# Patient Record
Sex: Female | Born: 1945 | Race: White | Hispanic: No | Smoking: Never smoker
Health system: Southern US, Community
[De-identification: ages and names within clinical notes are randomized; demographics above are authoritative.]

## PROBLEM LIST (undated history)

## (undated) DIAGNOSIS — E039 Hypothyroidism, unspecified: Secondary | ICD-10-CM

## (undated) DIAGNOSIS — I10 Essential (primary) hypertension: Secondary | ICD-10-CM

## (undated) DIAGNOSIS — I499 Cardiac arrhythmia, unspecified: Secondary | ICD-10-CM

## (undated) DIAGNOSIS — E079 Disorder of thyroid, unspecified: Secondary | ICD-10-CM

## (undated) DIAGNOSIS — C801 Malignant (primary) neoplasm, unspecified: Secondary | ICD-10-CM

## (undated) DIAGNOSIS — D693 Immune thrombocytopenic purpura: Secondary | ICD-10-CM

## (undated) HISTORY — PX: COLONOSCOPY: SHX5424

## (undated) HISTORY — PX: BACK SURGERY: SHX140

## (undated) HISTORY — DX: Hypothyroidism, unspecified: E03.9

## (undated) HISTORY — DX: Essential (primary) hypertension: I10

## (undated) HISTORY — DX: Immune thrombocytopenic purpura: D69.3

## (undated) HISTORY — DX: Disorder of thyroid, unspecified: E07.9

---

## 2011-12-18 ENCOUNTER — Ambulatory Visit: Payer: Self-pay | Admitting: Internal Medicine

## 2013-09-06 IMAGING — CR RIGHT FOREARM - 2 VIEW
1 series · 2 of 2 positions shown · non-contrast
Comparison: none

REASON FOR EXAM: fell; c/o radial area pain
COMMENTS:   LMP: Post-Menopausal

[Series 1: ap · 0.17mm/px · 2 of 2 slices shown]
[im 1/2]
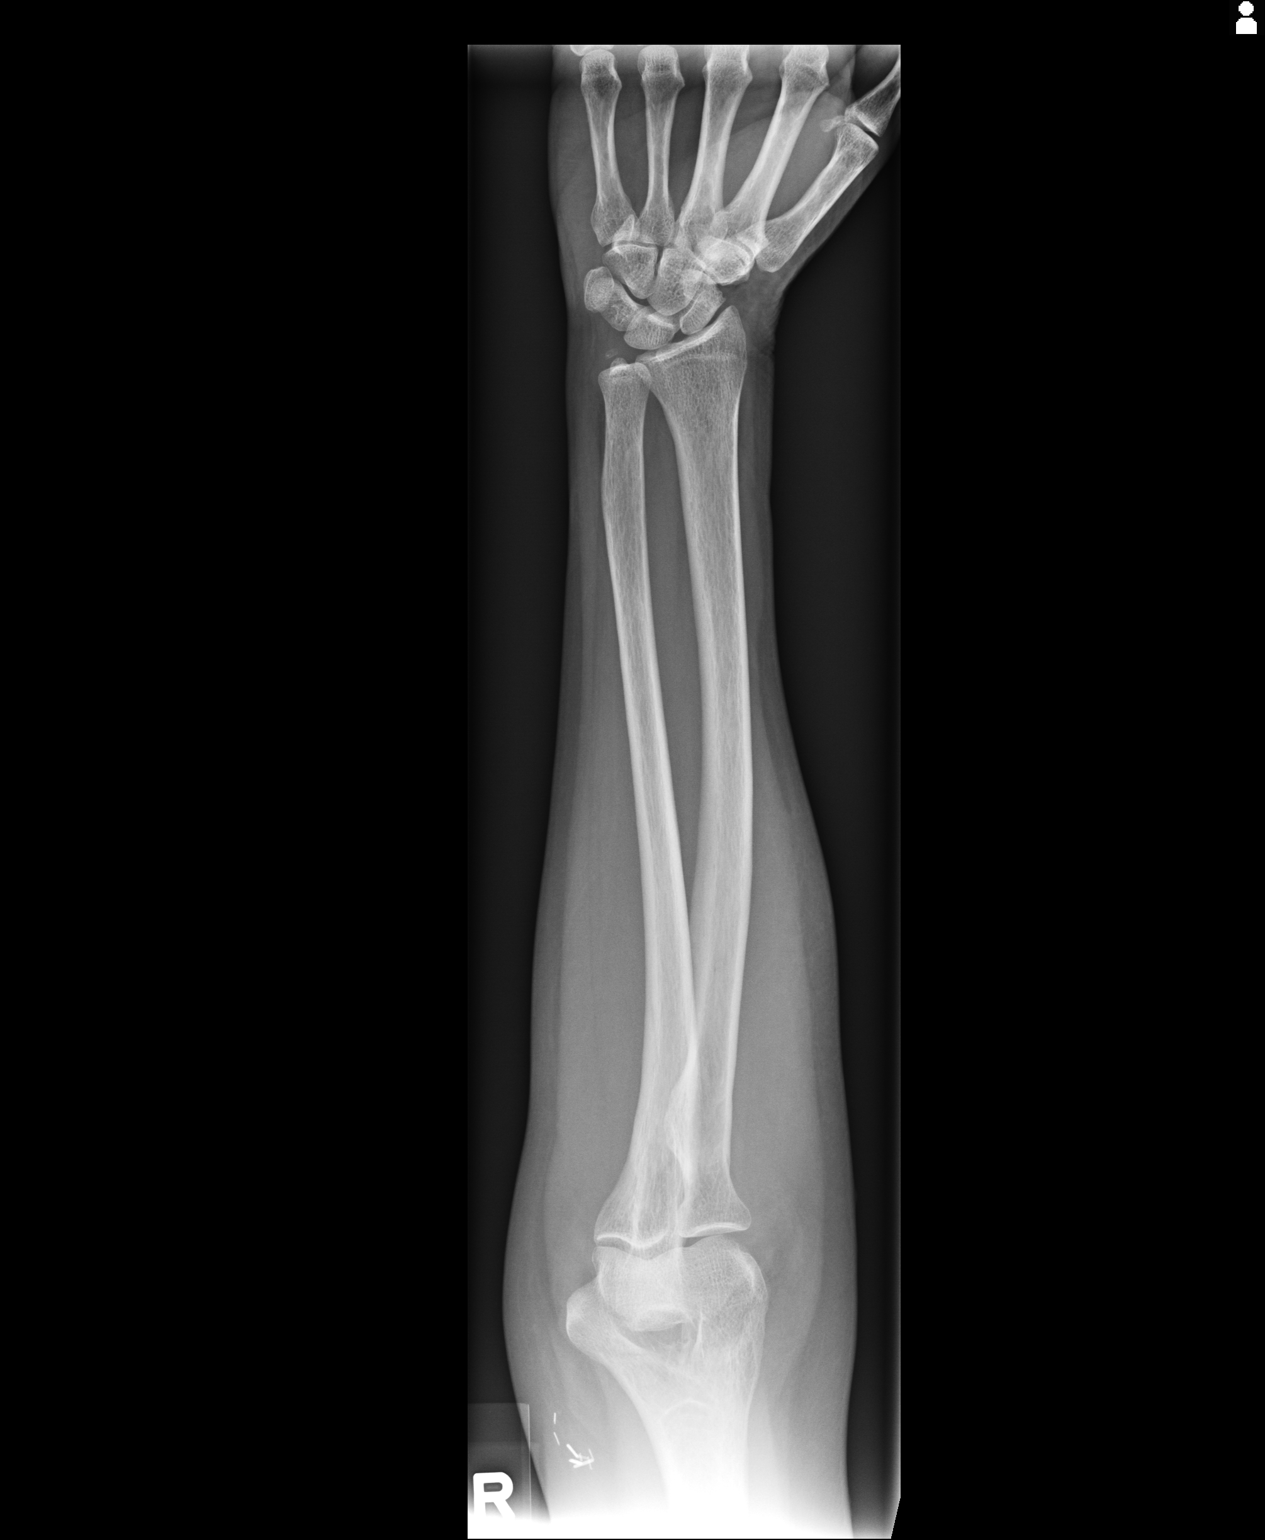
[im 2/2]
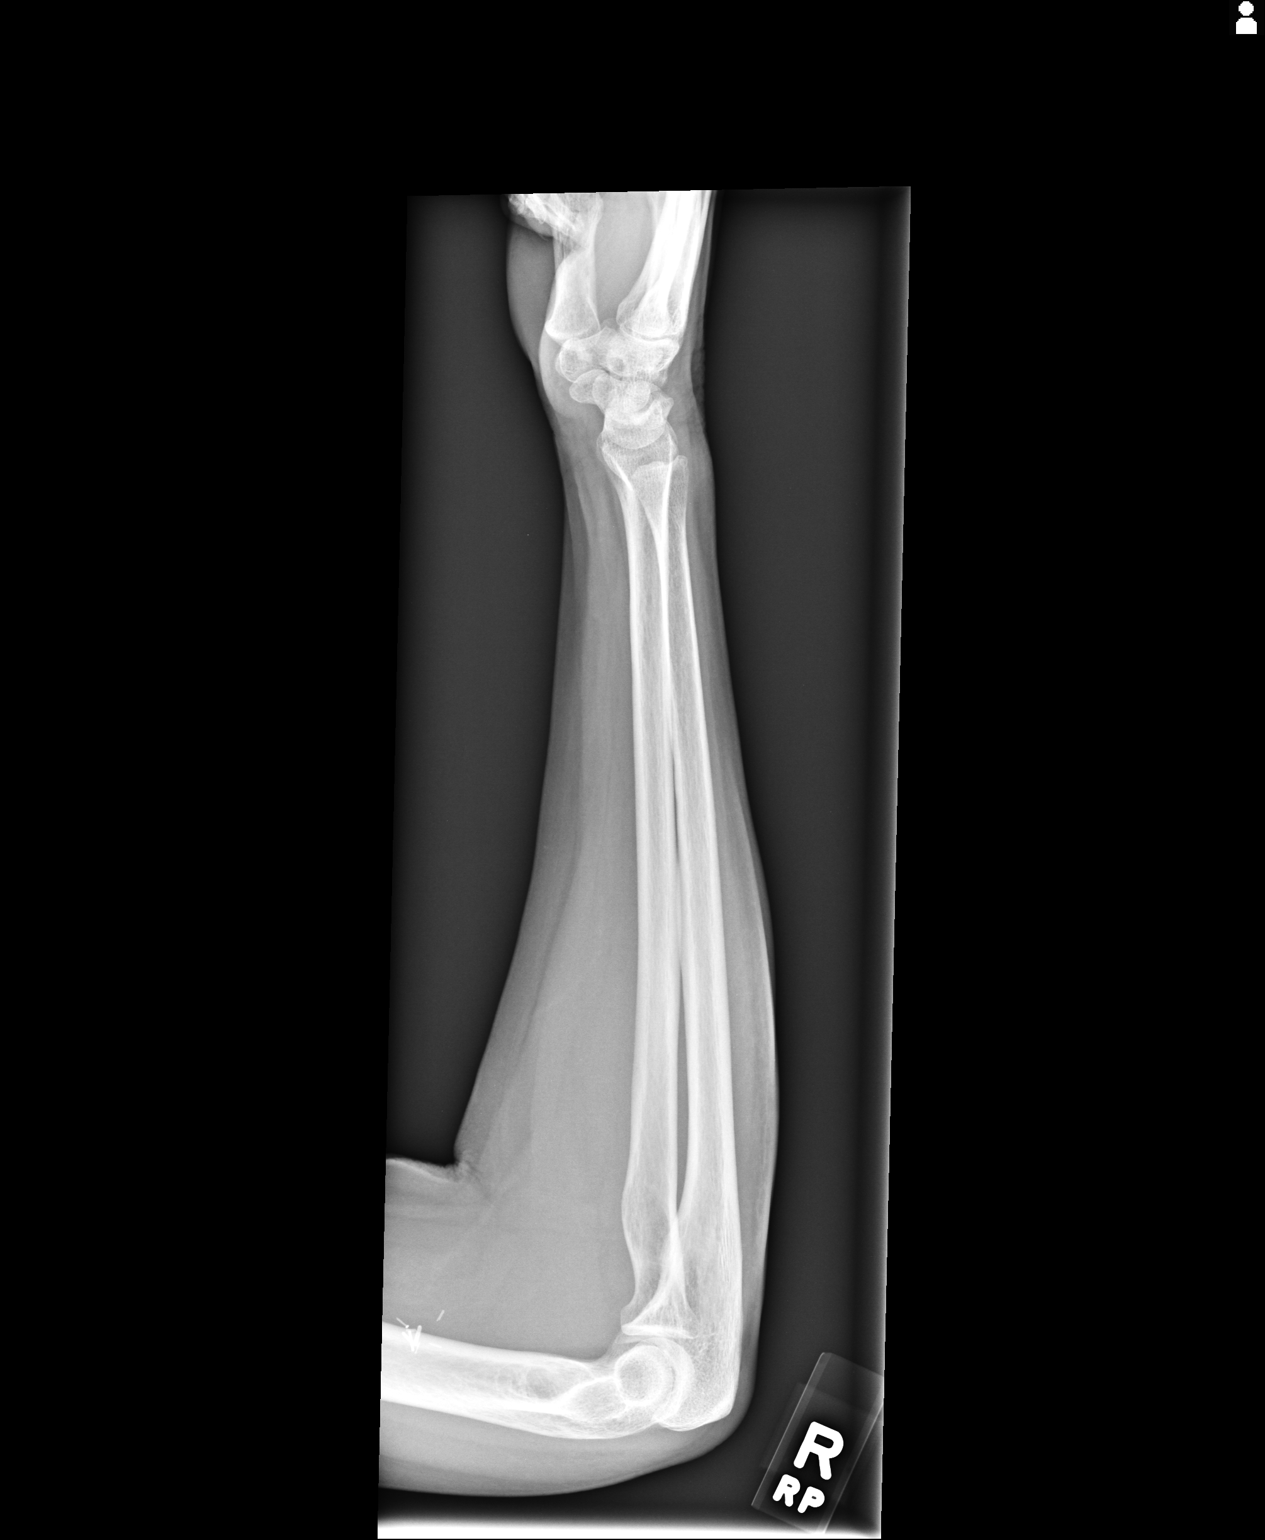

[2 of 2 positions shown; findings below may reference images not displayed]

PROCEDURE:     MDR - MDR FOREARM RIGHT  - December 18, 2011 [DATE]

RESULT:     AP and lateral views of the right forearm reveal the bones to be
adequately mineralized. There is no evidence of an acute fracture. The
observed portions of the elbow and wrist appear intact. There is a bony
density adjacent to the tip of the ulnar styloid which may reflect an old
avulsion fracture fragment. Correlation with any symptoms here is needed.
There are surgical clips over the lower numerous.
IMPRESSION: There is no acute bony abnormality of the shaft of the
right radius or ulna. Correlation with any symptoms referable to the ulnar
styloid region is needed.

[REDACTED]

## 2014-07-31 ENCOUNTER — Ambulatory Visit: Payer: Self-pay | Admitting: Family Medicine

## 2019-06-15 ENCOUNTER — Ambulatory Visit: Payer: Self-pay | Attending: Internal Medicine

## 2019-06-15 DIAGNOSIS — Z23 Encounter for immunization: Secondary | ICD-10-CM | POA: Insufficient documentation

## 2019-06-15 NOTE — Progress Notes (Signed)
   Covid-19 Vaccination Clinic  Name:  Samantha Mccoy    MRN: [id number] DOB: 07-14-1945  06/15/2019  [name] was observed post Covid-19 immunization for 15 minutes without incidence. She was provided with Vaccine Information Sheet and instruction to access the V-Safe system.   [name] was instructed to call 911 with any severe reactions post vaccine: Marland Kitchen Difficulty breathing  . Swelling of your face and throat  . A fast heartbeat  . A bad rash all over your body  . Dizziness and weakness    Immunizations Administered    Name Date Dose VIS Date Route   Pfizer COVID-19 Vaccine 06/15/2019 12:19 PM 0.3 mL 05/04/2019 Intramuscular   Manufacturer: [employer], [employer]   Lot: [id number]   NDC: [address]-4

## 2019-07-06 ENCOUNTER — Ambulatory Visit: Payer: Medicare PPO | Attending: Internal Medicine

## 2019-07-06 DIAGNOSIS — Z23 Encounter for immunization: Secondary | ICD-10-CM | POA: Insufficient documentation

## 2019-07-06 NOTE — Progress Notes (Signed)
   Covid-19 Vaccination Clinic  Name:  Samantha Mccoy    MRN: [id number] DOB: 07-15-1945  07/06/2019  Samantha Mccoy was observed post Covid-19 immunization for 15 minutes without incidence. She was provided with Vaccine Information Sheet and instruction to access the V-Safe system.   Samantha Mccoy was instructed to call 911 with any severe reactions post vaccine: Marland Kitchen Difficulty breathing  . Swelling of your face and throat  . A fast heartbeat  . A bad rash all over your body  . Dizziness and weakness    Immunizations Administered    Name Date Dose VIS Date Route   Pfizer COVID-19 Vaccine 07/06/2019 10:40 AM 0.3 mL 05/04/2019 Intramuscular   Manufacturer: [employer], [employer]   Lot: [id number]   NDC: [address]-2

## 2021-04-22 ENCOUNTER — Ambulatory Visit (INDEPENDENT_AMBULATORY_CARE_PROVIDER_SITE_OTHER): Payer: Medicare PPO

## 2021-04-22 ENCOUNTER — Ambulatory Visit: Payer: Medicare PPO | Admitting: Cardiology

## 2021-04-22 ENCOUNTER — Encounter: Payer: Self-pay | Admitting: Cardiology

## 2021-04-22 ENCOUNTER — Other Ambulatory Visit: Payer: Self-pay

## 2021-04-22 VITALS — BP 158/80 | HR 64 | Ht 64.0 in | Wt 162.0 lb

## 2021-04-22 DIAGNOSIS — I493 Ventricular premature depolarization: Secondary | ICD-10-CM

## 2021-04-22 DIAGNOSIS — I10 Essential (primary) hypertension: Secondary | ICD-10-CM | POA: Diagnosis not present

## 2021-04-22 NOTE — Patient Instructions (Addendum)
Medication Instructions:  Your physician recommends that you continue on your current medications as directed. Please refer to the Current Medication list given to you today. *If you need a refill on your cardiac medications before your next appointment, please call your pharmacy*  Lab Work: None ordered. If you have labs (blood work) drawn today and your tests are completely normal, you will receive your results only by: MyChart Message (if you have MyChart) OR A paper copy in the mail If you have any lab test that is abnormal or we need to change your treatment, we will call you to review the results.  Testing/Procedures: Your physician has requested that you have an echocardiogram. Echocardiography is a painless test that uses sound waves to create images of your heart. It provides your doctor with information about the size and shape of your heart and how well your heart's chambers and valves are working. This procedure takes approximately one hour. There are no restrictions for this procedure.  Please schedule for ECHO  Your physician has recommended that you wear a holter monitor. Holter monitors are medical devices that record the heart's electrical activity. Doctors most often use these monitors to diagnose arrhythmias. Arrhythmias are problems with the speed or rhythm of the heartbeat. The monitor is a small, portable device. You can wear one while you do your normal daily activities. This is usually used to diagnose what is causing palpitations/syncope (passing out).  You will wear a 14 day ZIO monitor  Follow-Up:  Your next appointment:   Your physician wants you to follow-up in: 6-8 weeks with one of the following Advanced Practice Providers on your designated Care Team:   Nicolasa Ducking, NP Eula Listen, PA-C Marisue Ivan, PA-C Cadence Fransico Michael, [place]   Your physician has recommended that you wear a Zio monitor.   This monitor is a medical device that records the heart's  electrical activity. Doctors most often use these monitors to diagnose arrhythmias. Arrhythmias are problems with the speed or rhythm of the heartbeat. The monitor is a small device applied to your chest. You can wear one while you do your normal daily activities. While wearing this monitor if you have any symptoms to push the button and record what you felt. Once you have worn this monitor for the period of time provider prescribed (Usually 14 days), you will return the monitor device in the postage paid box. Once it is returned they will download the data collected and provide [place] with a report which the provider will then review and we will call you with those results. Important tips:  Avoid showering during the first 24 hours of wearing the monitor. Avoid excessive sweating to help maximize wear time. Do not submerge the device, no hot tubs, and no swimming pools. Keep any lotions or oils away from the patch. After 24 hours you may shower with the patch on. Take brief showers with your back facing the shower head.  Do not remove patch once it has been placed because that will interrupt data and decrease adhesive wear time. Push the button when you have any symptoms and write down what you were feeling. Once you have completed wearing your monitor, remove and place into box which has postage paid and place in your outgoing mailbox.  If for some reason you have misplaced your box then call our office and we can provide another box and/or mail it off for you.

## 2021-04-22 NOTE — Progress Notes (Signed)
Electrophysiology Office Note:    Date:  04/22/2021   ID:  Samantha Mccoy, Samantha Mccoy 10/28/1945, MRN [id number]  PCP:  Enid Baas, MD  [facility] Cardiologist:  None  [facility] Electrophysiologist:  Lanier Prude, MD   Referring MD: Enid Baas, MD   Chief Complaint: PVCs  History of Present Illness:    Samantha Mccoy is a 75 y.o. female who presents for an evaluation of PVCs at the request of Dr. Nemiah Commander. Their medical history includes palpitations, hypothyroidism, hypertension.  The patient last saw Dr. Nemiah Commander March 23, 2021.  At that appointment she reported worsening palpitations. She tells me she previously wore a heart monitor which showed about 2000 PVCs per day.  The PVC started in 2013 or 2014.  She was prescribed verapamil for these and potassium.  That helped but the verapamil caused fatigue.  Because of the fatigue she decreased her verapamil to once a day.  In October the PVCs worsen so she went back to twice a day which seems to have helped the PVC burden. She also tells me that she is short of breath when walking up inclines.  She tries to go on walks at least 3 times per week.  She is trying to lose weight.   Past Medical History:  Diagnosis Date   Hypertension    Thyroid disease     Past Surgical History:  Procedure Laterality Date   BACK SURGERY      Current Medications: Current Meds  Medication Sig   ascorbic acid (VITAMIN C) 1000 MG tablet Take by mouth.   B Complex Vitamins (VITAMIN-B COMPLEX PO) Take by mouth.   Cholecalciferol 50 MCG (2000 UT) CAPS Take by mouth.   fluticasone (FLONASE) 50 MCG/ACT nasal spray Place into the nose.   Lactobacillus Rhamnosus, GG, (RA PROBIOTIC DIGESTIVE CARE) CAPS Take by mouth.   levothyroxine (SYNTHROID) 25 MCG tablet Take by mouth.   losartan (COZAAR) 50 MG tablet Take by mouth.   MAGNESIUM PO Take by mouth.   melatonin 3 MG TABS tablet Take by mouth.   potassium chloride  (KLOR-CON) 10 MEQ tablet Take by mouth.   TURMERIC PO Take by mouth.   Ubiquinol 100 MG CAPS Take by mouth.   verapamil (VERELAN PM) 120 MG 24 hr capsule Take 120 mg by mouth 2 (two) times daily.     Allergies:   Ace inhibitors, Penicillins, Lactose, Promethazine, and Tilactase   Social History   Socioeconomic History   Marital status: Widowed    Spouse name: Not on file   Number of children: Not on file   Years of education: Not on file   Highest education level: Not on file  Occupational History   Not on file  Tobacco Use   Smoking status: Never   Smokeless tobacco: Never  Substance and Sexual Activity   Alcohol use: Yes   Drug use: Never   Sexual activity: Not on file  Other Topics Concern   Not on file  Social History Narrative   Not on file   Social Determinants of Health   Financial Resource Strain: Not on file  Food Insecurity: Not on file  Transportation Needs: Not on file  Physical Activity: Not on file  Stress: Not on file  Social Connections: Not on file     Family History: The patient's family history includes Heart disease in her brother, father, and mother; Hyperlipidemia in her brother; Hypertension in her brother.  ROS:   Please see the  history of present illness.    All other systems reviewed and are negative.  EKGs/Labs/Other Studies Reviewed:    The following studies were reviewed today:   EKG:  The ekg ordered today demonstrates sinus rhythm.  No PVCs.  Normal intervals.  Left anterior fascicular block.   Recent Labs: No results found for requested labs within last 8760 hours.  Recent Lipid Panel No results found for: CHOL, TRIG, HDL, CHOLHDL, VLDL, LDLCALC, LDLDIRECT  Physical Exam:    VS:  BP (!) 158/80 (BP Location: Left Arm, Patient Position: Sitting, Cuff Size: Normal)   Pulse 64   Ht 5\' 4"  (1.626 m)   Wt 162 lb (73.5 kg)   SpO2 98%   BMI 27.81 kg/m     Wt Readings from Last 3 Encounters:  04/22/21 162 lb (73.5 kg)      GEN:  Well nourished, well developed in no acute distress HEENT: Normal NECK: No JVD; No carotid bruits LYMPHATICS: No lymphadenopathy CARDIAC: RRR, no murmurs, rubs, gallops RESPIRATORY:  Clear to auscultation without rales, wheezing or rhonchi  ABDOMEN: Soft, non-tender, non-distended MUSCULOSKELETAL:  No edema; No deformity  SKIN: Warm and dry NEUROLOGIC:  Alert and oriented x 3 PSYCHIATRIC:  Normal affect       ASSESSMENT:    1. Primary hypertension   2. PVC's (premature ventricular contractions)    PLAN:    In order of problems listed above:  #PVCs Low burden on prior heart monitors.  I do not think the low burden would justify an antiarrhythmic drug.  For now, would recommend continuing verapamil once a day.  I have advised her that if she has a particularly bad day of PVCs, she can increase the frequency to twice daily.  I told her that it is normal for PVC burden is to wax and wane.  We will repeat a 2-week ZIO monitor to reassess her burden of PVCs. We will also repeat an echocardiogram to assess the left ventricular function given some of her dyspnea with exertion.  #Hypertension Slightly above goal today.  Recommend continuing home monitoring 1-2 times per week.    Follow-up 6 weeks with APP      Medication Adjustments/Labs and Tests Ordered: Current medicines are reviewed at length with the patient today.  Concerns regarding medicines are outlined above.  Orders Placed This Encounter  Procedures   LONG TERM MONITOR (3-14 DAYS)   EKG 12-Lead   ECHOCARDIOGRAM COMPLETE    No orders of the defined types were placed in this encounter.    Signed, 04/24/21. Rossie Muskrat, MD, [facility], [facility] 04/22/2021 10:46 AM    Electrophysiology [facility]

## 2021-04-27 DIAGNOSIS — I493 Ventricular premature depolarization: Secondary | ICD-10-CM | POA: Diagnosis not present

## 2021-04-30 ENCOUNTER — Ambulatory Visit: Payer: Medicare PPO | Admitting: Cardiology

## 2021-05-26 ENCOUNTER — Ambulatory Visit (INDEPENDENT_AMBULATORY_CARE_PROVIDER_SITE_OTHER): Payer: Medicare PPO

## 2021-05-26 ENCOUNTER — Other Ambulatory Visit: Payer: Self-pay

## 2021-05-26 DIAGNOSIS — I083 Combined rheumatic disorders of mitral, aortic and tricuspid valves: Secondary | ICD-10-CM

## 2021-05-26 DIAGNOSIS — I493 Ventricular premature depolarization: Secondary | ICD-10-CM

## 2021-05-26 DIAGNOSIS — I5030 Unspecified diastolic (congestive) heart failure: Secondary | ICD-10-CM | POA: Diagnosis not present

## 2021-05-26 LAB — ECHOCARDIOGRAM COMPLETE
AR max vel: 2.48 cm2
AV Area VTI: 2.57 cm2
AV Area mean vel: 2.29 cm2
AV Mean grad: 4 mmHg
AV Peak grad: 6.7 mmHg
AV Vena cont: 0.3 cm
Ao pk vel: 1.29 m/s
Area-P 1/2: 2.99 cm2
Calc EF: 65.5 %
P 1/2 time: 1031 msec
S' Lateral: 2.3 cm
Single Plane A2C EF: 64.3 %
Single Plane A4C EF: 65.6 %

## 2021-06-05 ENCOUNTER — Telehealth: Payer: Self-pay | Admitting: Cardiology

## 2021-06-05 NOTE — Telephone Encounter (Signed)
See result note.  

## 2021-06-05 NOTE — Telephone Encounter (Signed)
Patient is returning call to discuss echo results. °

## 2021-06-12 ENCOUNTER — Other Ambulatory Visit: Payer: Self-pay

## 2021-06-12 ENCOUNTER — Ambulatory Visit: Payer: Medicare PPO | Admitting: Nurse Practitioner

## 2021-06-12 ENCOUNTER — Encounter: Payer: Self-pay | Admitting: Nurse Practitioner

## 2021-06-12 VITALS — BP 136/80 | HR 66 | Ht 64.0 in | Wt 160.0 lb

## 2021-06-12 DIAGNOSIS — E039 Hypothyroidism, unspecified: Secondary | ICD-10-CM

## 2021-06-12 DIAGNOSIS — I493 Ventricular premature depolarization: Secondary | ICD-10-CM

## 2021-06-12 DIAGNOSIS — I10 Essential (primary) hypertension: Secondary | ICD-10-CM

## 2021-06-12 DIAGNOSIS — R002 Palpitations: Secondary | ICD-10-CM

## 2021-06-12 DIAGNOSIS — I491 Atrial premature depolarization: Secondary | ICD-10-CM

## 2021-06-12 NOTE — Progress Notes (Signed)
Cardiology Office Note:    Date:  06/12/2021   ID:  Samantha Mccoy [place], DOB [birth year], MRN [id number]  PCP:  Enid Baas, MD   [facility] Providers Cardiologist:  None Electrophysiologist:  Lanier Prude, MD     Referring MD: Enid Baas, MD   Chief Complaint  Patient presents with   Other    6-8 week follow up -- Meds reviewed verbally with patient.   Follow up of palpitations  History of Present Illness:    Samantha Mccoy is a very pleasant 76 y.o. female with a hx of palpitations, hypothyroidism, and hypertension.   She was referred to our group by her PCP for evaluation of palpitations and was seen by Dr. Lalla Brothers on 04/22/2021.  She reported that she had previously worn a heart monitor which showed about 2000 PVCs per day.  PVCs started in 2013 or 2014 and she was prescribed verapamil and potassium for these.  She decreased her dose of verapamil due to fatigue.  In October, 2022 the PVCs worsened so she went back to taking verapamil twice a day which has helped.  She also reported shortness of breath when walking up inclines.  She was trying to go on walks at least 3 times per week.  Dr. Lalla Brothers ordered echocardiogram and cardiac monitor which revealed 1 episode of SVT lasting 5 beats, rare ventricular and supraventricular ectopy, patient triggered events corresponding to PAC/PVC.  Echocardiogram revealed LVEF 55 to 60%, no regional wall abnormalities, no LVH, G1DD, and mild TR, AI, MR. She was advised to follow-up in 6 weeks.   Today, she is here alone for follow-up. She is a retired Engineer, civil (consulting). She reports only occasional palpitations more noticeable when she is resting.  She is feeling well on 1 verapamil daily and is aware that she can take a second if she has an increase in palpitations.  She reports home blood pressure readings are consistently in the 120s to 130s mmHg systolic and 60s to 70s diastolic. She denies chest pain, shortness of breath,  lower extremity edema, fatigue, diaphoresis, weakness, presyncope, syncope, orthopnea, and PND.  She is walking about 2-1/2 miles several times per week and does not have increased palpitations with exercise.  Recently established with a new primary care provider and is scheduled for lab work in 4 days.  I have asked her to share those results with [place].    Past Medical History:  Diagnosis Date   Hypertension    Thyroid disease     Past Surgical History:  Procedure Laterality Date   BACK SURGERY      Current Medications: Current Meds  Medication Sig   ascorbic acid (VITAMIN C) 1000 MG tablet Take by mouth.   B Complex Vitamins (VITAMIN-B COMPLEX PO) Take by mouth.   Cholecalciferol 50 MCG (2000 UT) CAPS Take by mouth.   fluticasone (FLONASE) 50 MCG/ACT nasal spray Place into the nose.   Lactobacillus Rhamnosus, GG, (RA PROBIOTIC DIGESTIVE CARE) CAPS Take by mouth.   levothyroxine (SYNTHROID) 25 MCG tablet Take by mouth.   losartan (COZAAR) 50 MG tablet Take by mouth.   MAGNESIUM PO Take 250 mg by mouth daily.   melatonin 3 MG TABS tablet Take by mouth.   potassium chloride (KLOR-CON) 10 MEQ tablet Take 10 mEq by mouth 2 (two) times daily.   TURMERIC PO Take by mouth.   Ubiquinol 100 MG CAPS Take by mouth.   verapamil (VERELAN PM) 120 MG 24 hr capsule Take 120 mg by  mouth daily.     Allergies:   Ace inhibitors, Penicillins, Lactose, Promethazine, and Tilactase   Social History   Socioeconomic History   Marital status: Widowed    Spouse name: Not on file   Number of children: Not on file   Years of education: Not on file   Highest education level: Not on file  Occupational History   Not on file  Tobacco Use   Smoking status: Never   Smokeless tobacco: Never  Substance and Sexual Activity   Alcohol use: Yes   Drug use: Never   Sexual activity: Not on file  Other Topics Concern   Not on file  Social History Narrative   Not on file   Social Determinants of Health    Financial Resource Strain: Not on file  Food Insecurity: Not on file  Transportation Needs: Not on file  Physical Activity: Not on file  Stress: Not on file  Social Connections: Not on file     Family History: The patient's family history includes Heart disease in her brother, father, and mother; Hyperlipidemia in her brother; Hypertension in her brother.  ROS:   Please see the history of present illness. All other systems reviewed and are negative.  Labs/Other Studies Reviewed:    The following studies were reviewed today:  Echo 05/26/21  Left Ventricle: Left ventricular ejection fraction, by estimation, is 55  to 60%. The left ventricle has normal function. The left ventricle has no  regional wall motion abnormalities. The left ventricular internal cavity  size was normal in size. There is  no left ventricular hypertrophy. Left ventricular diastolic parameters are consistent with Grade I diastolic dysfunction (impaired relaxation).  Right Ventricle: The right ventricular size is normal. No increase in  right ventricular wall thickness. Right ventricular systolic function is  normal. There is normal pulmonary artery systolic pressure. The tricuspid  regurgitant velocity is 2.35 m/s, and with an assumed right atrial pressure of 5 mmHg, the estimated right ventricular systolic pressure is 27.1 mmHg.  Left Atrium: Left atrial size was normal in size.  Right Atrium: Right atrial size was normal in size.  Pericardium: There is no evidence of pericardial effusion.  Mitral Valve: The mitral valve is normal in structure. Mild mitral valve  regurgitation. No evidence of mitral valve stenosis.  Tricuspid Valve: The tricuspid valve is normal in structure. Tricuspid  valve regurgitation is mild to moderate. No evidence of tricuspid  stenosis.  Aortic Valve: The aortic valve is tricuspid. Aortic valve regurgitation is  mild. Aortic regurgitation PHT measures 1031 msec. No aortic stenosis  is  present. Aortic valve mean gradient measures 4.0 mmHg. Aortic valve peak  gradient measures 6.7 mmHg. Aortic valve area, by VTI measures 2.57 cm.  Pulmonic Valve: The pulmonic valve was normal in structure. Pulmonic valve  regurgitation is not visualized. No evidence of pulmonic stenosis.  Aorta: The aortic root is normal in size and structure.  Venous: The inferior vena cava is normal in size with greater than 50%  respiratory variability, suggesting right atrial pressure of 3 mmHg.  IAS/Shunts: No atrial level shunt detected by color flow Doppler.    Cardiac monitor 05/19/21  HR 45 - 150bpm, average 67 bpm. 1 SVT lasting 5 beats. Ectopic atrial rhythm present. Rare supraventricular and ventricular ectopy. Patient symptoms trigger events correspond to PAC/PVC.  Recent Labs: No results found for requested labs within last 8760 hours.  Recent Lipid Panel No results found for: CHOL, TRIG, HDL, CHOLHDL, VLDL, LDLCALC,  LDLDIRECT   Risk Assessment/Calculations:      Physical Exam:    VS:  BP 136/80 (BP Location: Left Arm, Patient Position: Sitting, Cuff Size: Normal)    Pulse 66    Ht 5\' 4"  (1.626 m)    Wt 160 lb (72.6 kg)    SpO2 99%    BMI 27.46 kg/m     Wt Readings from Last 3 Encounters:  06/12/21 160 lb (72.6 kg)  04/22/21 162 lb (73.5 kg)     GEN:  Well nourished, well developed in no acute distress HEENT: Normal NECK: No JVD; No carotid bruits CARDIAC: RRR, no murmurs, rubs, gallops RESPIRATORY:  Clear to auscultation without rales, wheezing or rhonchi  ABDOMEN: Soft, non-tender, non-distended MUSCULOSKELETAL:  No edema; No deformity. 2+ pedal pulses, equal bilaterally SKIN: Warm and dry NEUROLOGIC:  Alert and oriented x 3 PSYCHIATRIC:  Normal affect   EKG:  EKG is not ordered today.    Diagnoses:    1. Palpitations   2. PVC's (premature ventricular contractions)   3. Essential hypertension   4. Premature atrial contractions   5. Hypothyroidism,  unspecified type    Assessment and Plan:     Palpitations/PVCs/PACs: Cardiac monitor showed low burden of ectopy. She had 1 episode of SVT lasting 5 beats that she is not specifically aware of while wearing the monitor.  She is tolerating verapamil without concerns. She is exercising regularly without worsening of palpitations.  She is aware she can take a second verapamil if she has increased palpitations.  She has been taking potassium and magnesium for many years for palpitations. I asked her to please take note of her potassium level on upcoming blood work due to the fact that she is also on losartan.  She will request lab results be sent to us. Continue verapamil.   Essential hypertension: Blood pressure is stable today and she reports similar readings at home. Continue losartan.  Hypothyroid: She was started on levothyroxine in October 2022.  She is aware that altered thyroid function can affect palpitations and we will continue to monitor.  Management per PCP.  Disposition: 1 year with Dr. Lalla BrothersLambert   Medication Adjustments/Labs and Tests Ordered: Current medicines are reviewed at length with the patient today.  Concerns regarding medicines are outlined above.  No orders of the defined types were placed in this encounter.  No orders of the defined types were placed in this encounter.   Patient Instructions  Medication Instructions:  No changes at this time.  *If you need a refill on your cardiac medications before your next appointment, please call your pharmacy*   Lab Work: None  If you have labs (blood work) drawn today and your tests are completely normal, you will receive your results only by: MyChart Message (if you have MyChart) OR A paper copy in the mail If you have any lab test that is abnormal or we need to change your treatment, we will call you to review the results.   Testing/Procedures: None   Follow-Up: At [facility], you and your health needs are our  priority.  As part of our continuing mission to provide you with exceptional heart care, we have created designated Provider Care Teams.  These Care Teams include your primary Cardiologist (physician) and Advanced Practice Providers (APPs -  Physician Assistants and Nurse Practitioners) who all work together to provide you with the care you need, when you need it.   Your next appointment:   1 year(s)  The  format for your next appointment:   In Person  Provider:   Steffanie Dunnameron Lambert, MD     Signed, Bernis Stecher, Zachary GeorgeMichelle M, NP  06/12/2021 11:13 AM    [facility]

## 2021-06-12 NOTE — Patient Instructions (Signed)
Medication Instructions:  No changes at this time.  *If you need a refill on your cardiac medications before your next appointment, please call your pharmacy*   Lab Work: None  If you have labs (blood work) drawn today and your tests are completely normal, you will receive your results only by: [place] (if you have MyChart) OR A paper copy in the mail If you have any lab test that is abnormal or we need to change your treatment, we will call you to review the results.   Testing/Procedures: None   Follow-Up: At [facility], you and your health needs are our priority.  As part of our continuing mission to provide you with exceptional heart care, we have created designated Provider Care Teams.  These Care Teams include your primary Cardiologist (physician) and Advanced Practice Providers (APPs -  Physician Assistants and Nurse Practitioners) who all work together to provide you with the care you need, when you need it.   Your next appointment:   1 year(s)  The format for your next appointment:   In Person  Provider:   Lars Mage, MD

## 2022-07-16 ENCOUNTER — Other Ambulatory Visit: Payer: Medicare PPO

## 2022-07-16 ENCOUNTER — Encounter: Payer: Medicare PPO | Admitting: Oncology

## 2022-08-27 ENCOUNTER — Encounter: Payer: Self-pay | Admitting: Cardiology

## 2022-08-27 DIAGNOSIS — I10 Essential (primary) hypertension: Secondary | ICD-10-CM | POA: Insufficient documentation

## 2022-08-27 DIAGNOSIS — I491 Atrial premature depolarization: Secondary | ICD-10-CM | POA: Insufficient documentation

## 2022-08-27 DIAGNOSIS — R002 Palpitations: Secondary | ICD-10-CM | POA: Insufficient documentation

## 2022-08-27 NOTE — Progress Notes (Unsigned)
Cardiology Office Note Date:  08/30/2022  Patient ID:  Samantha Mccoy, Samantha Mccoy 10-19-1945, MRN [id number] PCP:  Enid Baas, MD  Cardiologist:  None Electrophysiologist: Lanier Prude, MD   Chief Complaint: 1 year follow-up, past due  History of Present Illness: Samantha Mccoy is a 77 y.o. female with PMH notable for HTN, palpitations, PAC, PVCs; seen today for Lanier Prude, MD for routine electrophysiology followup.  She last saw NP Swinyer 05/2021 was doing well on verapamil daily with additional dose PRN for increased palpitations. Has not seen Lalla Brothers since 03/2021.  Since last being seen, the patient has been diagnosed with Hashimoto's and ITP. For her ITP, she was on high dose prednisone, now off. She did not tolerate being on high dose steroids well: felt terrible, weight gain, hand trembling, shakiness, but platelets did improve. She has been off prednisone for about a week and hopes that she will not have to go back on it.   She has not been very active as of late, was told to limit activities when platelets were so low. Recently went for a walk of about 1 mile and noticed some SOB, needed to stop to catch her breath where she previously walked 2-3 miles several times a week. No CP, dizziness, syncope or presyncope when walking and SOB. SOB resolved quickly with rest.   She notes that her palpitations were much worse while on prednisone. Has been taking verapamil 120mg  BID - much better control when taking BID.   She takes her BP a couple times a week at home, readings 120-130s/70-80s.    Past Medical History:  Diagnosis Date   Hypertension    Hypothyroidism    Idiopathic thrombocytopenic purpura (ITP)    Thyroid disease     Past Surgical History:  Procedure Laterality Date   BACK SURGERY      Current Outpatient Medications  Medication Instructions   ascorbic acid (VITAMIN C) 1000 MG tablet Oral, Every other day   B Complex Vitamins  (VITAMIN-B COMPLEX PO) Oral, Every other day   Cholecalciferol 50 MCG (2000 UT) CAPS Oral, Daily   eltrombopag (PROMACTA) 50 mg, Oral, Daily, Take on an empty stomach 1 hour before a meal or 2 hours after   fluticasone (FLONASE) 50 MCG/ACT nasal spray Each Nare, Daily   Lactobacillus Rhamnosus, GG, (RA PROBIOTIC DIGESTIVE CARE) CAPS Oral, Daily   levothyroxine (SYNTHROID) 25 mcg, Oral, Daily before breakfast   losartan (COZAAR) 50 mg, Oral, Daily   MAGNESIUM PO 250 mg, Oral, Daily   melatonin 3 MG TABS tablet Oral, At bedtime PRN   potassium chloride (KLOR-CON) 10 MEQ tablet 10 mEq, Oral, Daily   TURMERIC PO Oral, Daily   verapamil (VERELAN PM) 120 mg, Oral, 2 times daily      Social History:  The patient  reports that she has never smoked. She has never used smokeless tobacco. She reports that she does not currently use alcohol. She reports that she does not use drugs.   Family History:  The patient's family history includes Heart disease in her brother, father, and mother; Hyperlipidemia in her brother; Hypertension in her brother.  ROS:  Please see the history of present illness. All other systems are reviewed and otherwise negative.   PHYSICAL EXAM:  VS:  BP (!) 140/82 (BP Location: Left Arm, Patient Position: Sitting, Cuff Size: Normal)   Pulse 90   Ht 5' 3.5" (1.613 m)   Wt 166 lb (75.3 kg)   SpO2 98%  BMI 28.94 kg/m  BMI: Body mass index is 28.94 kg/m.  GEN- The patient is well appearing, alert and oriented x 3 today.   HEENT: normocephalic, atraumatic; sclera clear, conjunctiva pink; hearing intact; oropharynx clear; neck supple, no JVP Lungs- Clear to ausculation bilaterally, normal work of breathing.  No wheezes, rales, rhonchi Heart- Regular rate and rhythm, no murmurs, rubs or gallops, PMI not laterally displaced GI- soft, non-tender, non-distended, bowel sounds present, no hepatosplenomegaly Extremities- Trace peripheral edema. no clubbing or cyanosis; DP/PT/radial  pulses 2+ bilaterally MS- no significant deformity or atrophy Skin- warm and dry, no rash or lesion, Psych- euthymic mood, full affect Neuro- strength and sensation are intact   EKG is ordered. Personal review of EKG from today shows:  NSR, rate 90bpm; LAD. Poor R-wave progression  Recent Labs: No results found for requested labs within last 365 days.  No results found for requested labs within last 365 days.   CrCl cannot be calculated (No successful lab value found.).   Wt Readings from Last 3 Encounters:  08/30/22 166 lb (75.3 kg)  06/12/21 160 lb (72.6 kg)  04/22/21 162 lb (73.5 kg)     Additional studies reviewed include: Previous EP, cardiology notes.   TTE 05/26/2021  1. Left ventricular ejection fraction, by estimation, is 55 to 60%. The left ventricle has normal function. The left ventricle has no regional wall motion abnormalities. Left ventricular diastolic parameters are consistent with Grade I diastolic dysfunction (impaired relaxation).   2. Right ventricular systolic function is normal. The right ventricular  size is normal. There is normal pulmonary artery systolic pressure. The estimated right ventricular systolic pressure is 27.1 mmHg.   3. The mitral valve is normal in structure. Mild mitral valve regurgitation. No evidence of mitral stenosis.   4. Tricuspid valve regurgitation is mild to moderate.   5. The aortic valve is tricuspid. Aortic valve regurgitation is mild. No aortic stenosis is present.   6. The inferior vena cava is normal in size with greater than 50% respiratory variability, suggesting right atrial pressure of 3 mmHg.   Long Term Monitor, 03/2021 HR 45 - 150bpm, average 67 bpm. 1 SVT lasting 5 beats. Ectopic atrial rhythm present. Rare supraventricular and ventricular ectopy. Patient symptoms trigger events correspond to PAC/PVC.   ASSESSMENT AND PLAN:  #) palpitations #) PVC #) PACs Doing well on verapamil 120mg  BID We discussed  increasing daily dose, but patient prefers to continue dose as-is while thyroid and ITP issues are being more acutely managed  #) HTN Borderline high in office today Home readings normotensive  #) DOE Does not sound ischemic in nature Last echo with normal EF no LV WMA, Grade 1 DD [facility] Hgb/Hct normal May be due to deconditioning vs steroid-induced abrupt weight gain I've asked her to continue to be as active as possible, cont walking; monitor symptoms. If worsen or do not improve, will update echo   Current medicines are reviewed at length with the patient today.   The patient does not have concerns regarding her medicines.  The following changes were made today:   INCREASE verapamil 120mg  BID  Labs/ tests ordered today include:  Orders Placed This Encounter  Procedures   EKG 12-Lead     Disposition: Follow up with EP APP in in 6 months   Signed, Sherie DonSuzann Arneta Mahmood, NP  08/30/22  2:28 PM  Electrophysiology [facility]

## 2022-08-30 ENCOUNTER — Ambulatory Visit: Payer: Medicare PPO | Attending: Cardiology | Admitting: Cardiology

## 2022-08-30 ENCOUNTER — Encounter: Payer: Self-pay | Admitting: Cardiology

## 2022-08-30 VITALS — BP 140/82 | HR 90 | Ht 63.5 in | Wt 166.0 lb

## 2022-08-30 DIAGNOSIS — I491 Atrial premature depolarization: Secondary | ICD-10-CM | POA: Diagnosis not present

## 2022-08-30 DIAGNOSIS — R002 Palpitations: Secondary | ICD-10-CM | POA: Diagnosis not present

## 2022-08-30 DIAGNOSIS — I10 Essential (primary) hypertension: Secondary | ICD-10-CM

## 2022-08-30 DIAGNOSIS — I493 Ventricular premature depolarization: Secondary | ICD-10-CM | POA: Diagnosis not present

## 2022-08-30 DIAGNOSIS — R0609 Other forms of dyspnea: Secondary | ICD-10-CM

## 2022-08-30 MED ORDER — VERAPAMIL HCL ER 120 MG PO CP24: 120.0000 mg | ORAL_CAPSULE | Freq: Two times a day (BID) | ORAL | 1 refills | Status: DC

## 2022-08-30 NOTE — Patient Instructions (Addendum)
Medication Instructions:  INCREASE verapamil 120 mg to twice a day  *If you need a refill on your cardiac medications before your next appointment, please call your pharmacy*   Lab Work: No labs ordered  If you have labs (blood work) drawn today and your tests are completely normal, you will receive your results only by: MyChart Message (if you have MyChart) OR A paper copy in the mail If you have any lab test that is abnormal or we need to change your treatment, we will call you to review the results.   Testing/Procedures: No testing ordered  Follow-Up: At [facility], you and your health needs are our priority.  As part of our continuing mission to provide you with exceptional heart care, we have created designated Provider Care Teams.  These Care Teams include your primary Cardiologist (physician) and Advanced Practice Providers (APPs -  Physician Assistants and Nurse Practitioners) who all work together to provide you with the care you need, when you need it.  We recommend signing up for the patient portal called "MyChart".  Sign up information is provided on this After Visit Summary.  MyChart is used to connect with patients for Virtual Visits (Telemedicine).  Patients are able to view lab/test results, encounter notes, upcoming appointments, etc.  Non-urgent messages can be sent to your provider as well.   To learn more about what you can do with MyChart, go to [email or web address].    Your next appointment:   3-6 month(s)  Provider:   You may see Dr. Steffanie Dunn or one of the following Advanced Practice Providers on your designated Care Team:   Sherie Don, NP

## 2022-09-25 NOTE — Progress Notes (Unsigned)
Cardiology Office Note Date:  09/27/2022  Patient ID:  Samantha, Mccoy 1945/07/15, MRN [id number] PCP:  Enid Baas, MD  Cardiologist:  None Electrophysiologist: Samantha Prude, MD   Chief Complaint: palpitations  History of Present Illness: Samantha Mccoy is a 77 y.o. female with PMH notable for HTN, palpitations, PAC, PVCs; seen today for Samantha Prude, MD for routine electrophysiology followup.  She last saw NP Swinyer 05/2021 was doing well on verapamil daily with additional dose PRN for increased palpitations. Has not seen Lalla Brothers since 03/2021.  I saw her 08/2022, had recently been diagnosed with Hashimoto's and ITP. Had been on high-dose steroids for ITP, felt terrible but platelets improved. Having more palpitations when on steroids. Prior to steroids, palps under good control with 120mg  verapamil BID. She did not wish to adjust medications d/t ongoing hashimotos and ITP therapies. Having some new SOB w walking, ?deconditioning.  Today, she continues to have bothersome palpitations, that seem to be increasing. 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 She cotninues to take verapamil 120mg  BID. Platelets have been elevated as of late. PCP has been managing thyroid, had recommended to increase synthroid to , patient did not tolerate increased dose, so has remained at . Has not had updated labs in several months.   She is continuing to have SOB with strenuous activity - activities that used to not cause SOB. Also having fatigue after about 30 minutes of walking at moderate pace. Used to be able to walk + without symptoms. Has noticed increased swelling in hands (rings do not fit) and bilat feet.   BP readings at home are 110-140s systolic, rare 150  Past Medical History:  Diagnosis Date   Hypertension    Hypothyroidism    Idiopathic thrombocytopenic purpura (ITP) (HCC)    Thyroid disease     Past Surgical History:   Procedure Laterality Date   BACK SURGERY      Current Outpatient Medications  Medication Instructions   ascorbic acid (VITAMIN C) 1000 MG tablet Oral, Every other day   B Complex Vitamins (VITAMIN-B COMPLEX PO) Oral, Every other day   Cholecalciferol 50 MCG (2000 UT) CAPS Oral, Daily   eltrombopag (PROMACTA) 50 mg, Oral, Daily, Take on an empty stomach 1 hour before a meal or 2 hours after   fluticasone (FLONASE) 50 MCG/ACT nasal spray Each Nare, Daily   Lactobacillus Rhamnosus, GG, (RA PROBIOTIC DIGESTIVE CARE) CAPS Oral, Daily   levothyroxine (SYNTHROID) 25 mcg, Oral, Daily before breakfast   losartan (COZAAR) 50 mg, Oral, Daily   MAGNESIUM PO 250 mg, Oral, Daily   melatonin 3 MG TABS tablet Oral, At bedtime PRN   potassium chloride (KLOR-CON) 10 MEQ tablet 10 mEq, Oral, Daily   TURMERIC PO Oral, Daily   verapamil (VERELAN) 120 mg, Oral, 2 times daily     Social History:  The patient  reports that she has never smoked. She has never used smokeless tobacco. She reports that she does not currently use alcohol. She reports that she does not use drugs.   Family History:  The patient's family history includes Heart disease in her brother, father, and mother; Hyperlipidemia in her brother; Hypertension in her brother.  ROS:  Please see the history of present illness. All other systems are reviewed and otherwise negative.   PHYSICAL EXAM:  VS:  BP (!) 140/84 (BP Location: Left Arm, Patient Position: Sitting, Cuff Size: Normal)   Pulse (!) 59   Ht 5'  4" (1.626 m)   Wt 167 lb (75.8 kg)   SpO2 98%   BMI 28.67 kg/m  BMI: Body mass index is 28.67 kg/m.  GEN- The patient is well appearing, alert and oriented x 3 today.   Lungs- Clear to ausculation bilaterally, normal work of breathing.  Heart- Regular rate and rhythm, no murmurs, rubs or gallops Extremities- Trace peripheral edema, warm, dry    EKG is ordered. Personal review of EKG from today shows: SB, rate 59bpm, LAD. Poor  R-wave progression  Recent Labs: No results found for requested labs within last 365 days.  No results found for requested labs within last 365 days.   CrCl cannot be calculated (No successful lab value found.).   Wt Readings from Last 3 Encounters:  09/27/22 167 lb (75.8 kg)  08/30/22 166 lb (75.3 kg)  06/12/21 160 lb (72.6 kg)     Additional studies reviewed include: Previous EP, cardiology notes.   TTE 05/26/2021  1. Left ventricular ejection fraction, by estimation, is 55 to 60%. The left ventricle has normal function. The left ventricle has no regional wall motion abnormalities. Left ventricular diastolic parameters are consistent with Grade I diastolic dysfunction (impaired relaxation).   2. Right ventricular systolic function is normal. The right ventricular  size is normal. There is normal pulmonary artery systolic pressure. The estimated right ventricular systolic pressure is 27.1 mmHg.   3. The mitral valve is normal in structure. Mild mitral valve regurgitation. No evidence of mitral stenosis.   4. Tricuspid valve regurgitation is mild to moderate.   5. The aortic valve is tricuspid. Aortic valve regurgitation is mild. No aortic stenosis is present.   6. The inferior vena cava is normal in size with greater than 50% respiratory variability, suggesting right atrial pressure of 3 mmHg.   Long Term Monitor, 03/2021 HR 45 - 150bpm, average 67 bpm. 1 SVT lasting 5 beats. Ectopic atrial rhythm present. Rare supraventricular and ventricular ectopy. Patient symptoms trigger events correspond to PAC/PVC.   ASSESSMENT AND PLAN:  #) palpitations #) PVC #) PACs Increased symptomatic burden Cont 120 verapamil BID One week monitor to eval burden Consider PVC ablation if burden high Update electrolytes   #) HTN Borderline high in office today Home readings normotensive   #) DOE #) edema DOE has persisted despite increased activity Recent lab work at [facility] - Hgb/Hct  normal Last echo with normal EF no LV WMA, Grade 1 DD - update echo   #) Hashimotos Update thyroid labs  Current medicines are reviewed at length with the patient today.   The patient does not have concerns regarding her medicines.  The following changes were made today:   none   Labs/ tests ordered today include:  Orders Placed This Encounter  Procedures   Comp Met (CMET)   TSH   T4, free   LONG TERM MONITOR (3-14 DAYS)   EKG 12-Lead   ECHOCARDIOGRAM COMPLETE     Disposition: Follow up with EP APP in  6-8 weeks  after testing   Signed, Sherie Don, NP  09/27/22  10:16 AM  Electrophysiology [facility]

## 2022-09-27 ENCOUNTER — Other Ambulatory Visit
Admission: RE | Admit: 2022-09-27 | Discharge: 2022-09-27 | Disposition: A | Discharge status: Home / Self Care | Payer: Medicare PPO | Source: Ambulatory Visit | Attending: Cardiology | Admitting: Cardiology

## 2022-09-27 ENCOUNTER — Ambulatory Visit (INDEPENDENT_AMBULATORY_CARE_PROVIDER_SITE_OTHER): Payer: Medicare PPO

## 2022-09-27 ENCOUNTER — Ambulatory Visit: Payer: Medicare PPO | Attending: Cardiology | Admitting: Cardiology

## 2022-09-27 ENCOUNTER — Encounter: Payer: Self-pay | Admitting: Cardiology

## 2022-09-27 VITALS — BP 140/84 | HR 59 | Ht 64.0 in | Wt 167.0 lb

## 2022-09-27 DIAGNOSIS — R002 Palpitations: Secondary | ICD-10-CM

## 2022-09-27 DIAGNOSIS — R0609 Other forms of dyspnea: Secondary | ICD-10-CM

## 2022-09-27 DIAGNOSIS — I10 Essential (primary) hypertension: Secondary | ICD-10-CM

## 2022-09-27 DIAGNOSIS — I493 Ventricular premature depolarization: Secondary | ICD-10-CM | POA: Diagnosis not present

## 2022-09-27 DIAGNOSIS — R609 Edema, unspecified: Secondary | ICD-10-CM

## 2022-09-27 LAB — COMPREHENSIVE METABOLIC PANEL
ALT: 26 U/L (ref 0–44)
AST: 30 U/L (ref 15–41)
Albumin: 4.2 g/dL (ref 3.5–5.0)
Alkaline Phosphatase: 57 U/L (ref 38–126)
Anion gap: 9 (ref 5–15)
BUN: 15 mg/dL (ref 8–23)
CO2: 25 mmol/L (ref 22–32)
Calcium: 9.1 mg/dL (ref 8.9–10.3)
Chloride: 107 mmol/L (ref 98–111)
Creatinine, Ser: 0.75 mg/dL (ref 0.44–1.00)
GFR, Estimated: >60 mL/min (ref >60)
Glucose, Bld: 102 mg/dL — ABNORMAL HIGH (ref 70–99)
Potassium: 4 mmol/L (ref 3.5–5.1)
Sodium: 141 mmol/L (ref 135–145)
Total Bilirubin: 0.7 mg/dL (ref 0.3–1.2)
Total Protein: 6.8 g/dL (ref 6.5–8.1)

## 2022-09-27 LAB — TSH: TSH: 1.518 u[IU]/mL (ref 0.350–4.500)

## 2022-09-27 LAB — T4, FREE: Free T4: 0.69 ng/dL (ref 0.61–1.12)

## 2022-09-27 NOTE — Patient Instructions (Signed)
Medication Instructions:  Your physician recommends that you continue on your current medications as directed. Please refer to the Current Medication list given to you today.  *If you need a refill on your cardiac medications before your next appointment, please call your pharmacy*   Lab Work: CMP, TSH, free T4 - Please go to the [facility]. You will check in at the front desk to the right as you walk into the atrium. Valet Parking is offered if needed. - No appointment needed. You may go any day between 7 am and 6 pm.  If you have labs (blood work) drawn today and your tests are completely normal, you will receive your results only by: MyChart Message (if you have MyChart) OR A paper copy in the mail If you have any lab test that is abnormal or we need to change your treatment, we will call you to review the results.   Testing/Procedures: Your physician has requested that you have an echocardiogram. Echocardiography is a painless test that uses sound waves to create images of your heart. It provides your doctor with information about the size and shape of your heart and how well your heart's chambers and valves are working. This procedure takes approximately one hour. There are no restrictions for this procedure. Please do NOT wear cologne, perfume, aftershave, or lotions (deodorant is allowed). Please arrive 15 minutes prior to your appointment time.  Your physician has recommended that you wear a Zio monitor.   This monitor is a medical device that records the heart's electrical activity. Doctors most often use these monitors to diagnose arrhythmias. Arrhythmias are problems with the speed or rhythm of the heartbeat. The monitor is a small device applied to your chest. You can wear one while you do your normal daily activities. While wearing this monitor if you have any symptoms to push the button and record what you felt. Once you have worn this monitor for the period of time  provider prescribed (Usually 14 days), you will return the monitor device in the postage paid box. Once it is returned they will download the data collected and provide [place] with a report which the provider will then review and we will call you with those results. Important tips:  Avoid showering during the first 24 hours of wearing the monitor. Avoid excessive sweating to help maximize wear time. Do not submerge the device, no hot tubs, and no swimming pools. Keep any lotions or oils away from the patch. After 24 hours you may shower with the patch on. Take brief showers with your back facing the shower head.  Do not remove patch once it has been placed because that will interrupt data and decrease adhesive wear time. Push the button when you have any symptoms and write down what you were feeling. Once you have completed wearing your monitor, remove and place into box which has postage paid and place in your outgoing mailbox.  If for some reason you have misplaced your box then call our office and we can provide another box and/or mail it off for you.  Follow-Up: At [facility], you and your health needs are our priority.  As part of our continuing mission to provide you with exceptional heart care, we have created designated Provider Care Teams.  These Care Teams include your primary Cardiologist (physician) and Advanced Practice Providers (APPs -  Physician Assistants and Nurse Practitioners) who all work together to provide you with the care you need, when you  need it.  We recommend signing up for the patient portal called "MyChart".  Sign up information is provided on this After Visit Summary.  MyChart is used to connect with patients for Virtual Visits (Telemedicine).  Patients are able to view lab/test results, encounter notes, upcoming appointments, etc.  Non-urgent messages can be sent to your provider as well.   To learn more about what you can do with MyChart, go to  [email or web address].    Your next appointment:   2 month(s)  Provider:   Sherie Don, NP

## 2022-09-30 DIAGNOSIS — I493 Ventricular premature depolarization: Secondary | ICD-10-CM

## 2022-09-30 DIAGNOSIS — R002 Palpitations: Secondary | ICD-10-CM

## 2022-10-25 ENCOUNTER — Ambulatory Visit: Payer: Medicare PPO | Attending: Cardiology

## 2022-10-25 DIAGNOSIS — I351 Nonrheumatic aortic (valve) insufficiency: Secondary | ICD-10-CM | POA: Diagnosis not present

## 2022-10-25 DIAGNOSIS — I340 Nonrheumatic mitral (valve) insufficiency: Secondary | ICD-10-CM

## 2022-10-25 DIAGNOSIS — I493 Ventricular premature depolarization: Secondary | ICD-10-CM | POA: Diagnosis not present

## 2022-10-25 DIAGNOSIS — R002 Palpitations: Secondary | ICD-10-CM | POA: Diagnosis not present

## 2022-10-25 DIAGNOSIS — R609 Edema, unspecified: Secondary | ICD-10-CM

## 2022-10-25 LAB — ECHOCARDIOGRAM COMPLETE
AR max vel: 2.17 cm2
AV Area VTI: 2.2 cm2
AV Area mean vel: 2.15 cm2
AV Mean grad: 2 mmHg
AV Peak grad: 4.2 mmHg
Ao pk vel: 1.03 m/s
Area-P 1/2: 2.6 cm2
Calc EF: 57.1 %
P 1/2 time: 887 msec
S' Lateral: 2.4 cm
Single Plane A2C EF: 58.4 %
Single Plane A4C EF: 52.6 %

## 2022-10-26 ENCOUNTER — Telehealth: Payer: Self-pay | Admitting: Cardiology

## 2022-10-26 NOTE — Telephone Encounter (Signed)
Spoke with pt concerning echo results.

## 2022-10-26 NOTE — Telephone Encounter (Signed)
Patient is returning call for results. Transferred to [place], [place].

## 2022-11-30 NOTE — Progress Notes (Unsigned)
Cardiology Office Note Date:  09/27/2022  Patient ID:  Samantha Mccoy 1946-04-23, MRN [id number] PCP:  Enid Baas, MD  Cardiologist:  None Electrophysiologist: Lanier Prude, MD   Chief Complaint: palpitations  History of Present Illness: Samantha Mccoy is a 77 y.o. female with PMH notable for HTN, palpitations, PAC, PVCs, Hashimoto's; seen today for Lanier Prude, MD for routine electrophysiology followup.  I last saw patient 09/2022 where she c/o increase palpitations on 120mg  verapamil BID. She also had SOB with strenuous activities, and fatigue after walking , which was relatively new. I had recommended an updated echo and zio to confirm PVC burden. Echo grossly normal, zio showed 4% PVC burden that corresponded to symptomatic trigger. Electrolytes and thyroid labs stable.   On follow up today,   *** palp burden *** SOB w exercise *** home BP  She denies chest pain, palpitations, dyspnea, PND, orthopnea, nausea, vomiting, dizziness, syncope, edema, weight gain, or early satiety.       Past Medical History:  Diagnosis Date   Hypertension    Hypothyroidism    Idiopathic thrombocytopenic purpura (ITP) (HCC)    Thyroid disease     Past Surgical History:  Procedure Laterality Date   BACK SURGERY      Current Outpatient Medications  Medication Instructions   ascorbic acid (VITAMIN C) 1000 MG tablet Oral, Every other day   B Complex Vitamins (VITAMIN-B COMPLEX PO) Oral, Every other day   Cholecalciferol 50 MCG (2000 UT) CAPS Oral, Daily   eltrombopag (PROMACTA) 50 mg, Oral, Daily, Take on an empty stomach 1 hour before a meal or 2 hours after   fluticasone (FLONASE) 50 MCG/ACT nasal spray Each Nare, Daily   Lactobacillus Rhamnosus, GG, (RA PROBIOTIC DIGESTIVE CARE) CAPS Oral, Daily   levothyroxine (SYNTHROID) 25 mcg, Oral, Daily before breakfast   losartan (COZAAR) 50 mg, Oral, Daily   MAGNESIUM PO 250 mg, Oral, Daily    melatonin 3 MG TABS tablet Oral, At bedtime PRN   potassium chloride (KLOR-CON) 10 MEQ tablet 10 mEq, Oral, Daily   TURMERIC PO Oral, Daily   verapamil (VERELAN) 120 mg, Oral, 2 times daily     Social History:  The patient  reports that she has never smoked. She has never used smokeless tobacco. She reports that she does not currently use alcohol. She reports that she does not use drugs.   Family History:  The patient's family history includes Heart disease in her brother, father, and mother; Hyperlipidemia in her brother; Hypertension in her brother.  ROS:  Please see the history of present illness. All other systems are reviewed and otherwise negative.   PHYSICAL EXAM: *** VS:  BP (!) 140/84 (BP Location: Left Arm, Patient Position: Sitting, Cuff Size: Normal)   Pulse (!) 59   Ht 5\' 4"  (1.626 m)   Wt 167 lb (75.8 kg)   SpO2 98%   BMI 28.67 kg/m  BMI: Body mass index is 28.67 kg/m.  GEN- The patient is well appearing, alert and oriented x 3 today.   Lungs- Clear to ausculation bilaterally, normal work of breathing.  Heart- Regular rate and rhythm, no murmurs, rubs or gallops Extremities- ***Trace peripheral edema, warm, dry    EKG is not ordered. Personal review of EKG from 09/27/2022 shows: SB, rate 59bpm, LAD. Poor R-wave progression  Recent Labs: No results found for requested labs within last 365 days.  No results found for requested labs within last 365 days.   CrCl  cannot be calculated (No successful lab value found.).   Wt Readings from Last 3 Encounters:  09/27/22 167 lb (75.8 kg)  08/30/22 166 lb (75.3 kg)  06/12/21 160 lb (72.6 kg)     Additional studies reviewed include: Previous EP, cardiology notes.   TTE, 10/25/2022 1. Left ventricular ejection fraction, by estimation, is 55 to 60%. Left ventricular ejection fraction by 2D MOD biplane is 57.1 %. The left ventricle has normal function. The left ventricle has no regional wall motion abnormalities. There is  mild left ventricular hypertrophy. Left ventricular diastolic parameters are consistent with Grade I diastolic dysfunction (impaired relaxation).   2. Right ventricular systolic function is normal. The right ventricular size is normal.   3. The mitral valve is normal in structure. Mild mitral valve regurgitation.   4. The aortic valve is tricuspid. Aortic valve regurgitation is mild.   5. The inferior vena cava is normal in size with greater than 50% respiratory variability, suggesting right atrial pressure of 3 mmHg.   Comparison(s): 05/26/21 55-60%, Mild MR/AI, m/m TR.   Long Term monitor, 10/12/2022 HR 44 - 143 bpm, average 66 bpm. 1 nonsustained SVT lasting 6 beats. Rare supraventricular ectopy. Occasional ventricular ectopy, 4.2%. No sustained arrhythmias. No atrial fibrillation. Symptom trigger episodes correspond to sinus with PVCs.  TTE 05/26/2021  1. Left ventricular ejection fraction, by estimation, is 55 to 60%. The left ventricle has normal function. The left ventricle has no regional wall motion abnormalities. Left ventricular diastolic parameters are consistent with Grade I diastolic dysfunction (impaired relaxation).   2. Right ventricular systolic function is normal. The right ventricular  size is normal. There is normal pulmonary artery systolic pressure. The estimated right ventricular systolic pressure is 27.1 mmHg.   3. The mitral valve is normal in structure. Mild mitral valve regurgitation. No evidence of mitral stenosis.   4. Tricuspid valve regurgitation is mild to moderate.   5. The aortic valve is tricuspid. Aortic valve regurgitation is mild. No aortic stenosis is present.   6. The inferior vena cava is normal in size with greater than 50% respiratory variability, suggesting right atrial pressure of 3 mmHg.   Long Term Monitor, 03/2021 HR 45 - 150bpm, average 67 bpm. 1 SVT lasting 5 beats. Ectopic atrial rhythm present. Rare supraventricular and ventricular  ectopy. Patient symptoms trigger events correspond to PAC/PVC.   ASSESSMENT AND PLAN:  #) palpitations #) PVC #) PACs *** Increased symptomatic burden Cont 120 verapamil BID One week monitor to eval burden Consider PVC ablation if burden high Update electrolytes   #) HTN Borderline high in office today Home readings normotensive   #) DOE #) edema DOE has persisted despite increased activity Recent lab work at [facility] - Hgb/Hct normal Last echo with normal EF no LV WMA, Grade 1 DD - update echo   #) Hashimotos Update thyroid labs  Current medicines are reviewed at length with the patient today.   The patient does not have concerns regarding her medicines.  The following changes were made today:   none   Labs/ tests ordered today include:  Orders Placed This Encounter  Procedures   Comp Met (CMET)   TSH   T4, free   LONG TERM MONITOR (3-14 DAYS)   EKG 12-Lead   ECHOCARDIOGRAM COMPLETE     Disposition: Follow up with EP APP in  6-8 weeks  after testing   Signed, Sherie Don, NP  09/27/22  10:16 AM  Electrophysiology [facility]

## 2022-12-01 ENCOUNTER — Ambulatory Visit: Payer: Medicare PPO | Attending: Cardiology | Admitting: Cardiology

## 2022-12-01 ENCOUNTER — Encounter: Payer: Self-pay | Admitting: Cardiology

## 2022-12-01 VITALS — BP 140/82 | HR 67 | Ht 64.0 in | Wt 164.0 lb

## 2022-12-01 DIAGNOSIS — I493 Ventricular premature depolarization: Secondary | ICD-10-CM | POA: Diagnosis not present

## 2022-12-01 DIAGNOSIS — I10 Essential (primary) hypertension: Secondary | ICD-10-CM

## 2022-12-01 NOTE — Patient Instructions (Signed)
Medication Instructions:  Your physician recommends that you continue on your current medications as directed. Please refer to the Current Medication list given to you today.  *If you need a refill on your cardiac medications before your next appointment, please call your pharmacy*   Lab Work: No labs ordered  If you have labs (blood work) drawn today and your tests are completely normal, you will receive your results only by: MyChart Message (if you have MyChart) OR A paper copy in the mail If you have any lab test that is abnormal or we need to change your treatment, we will call you to review the results.   Testing/Procedures: No testing ordered  Follow-Up: At [facility], you and your health needs are our priority.  As part of our continuing mission to provide you with exceptional heart care, we have created designated Provider Care Teams.  These Care Teams include your primary Cardiologist (physician) and Advanced Practice Providers (APPs -  Physician Assistants and Nurse Practitioners) who all work together to provide you with the care you need, when you need it.  We recommend signing up for the patient portal called "MyChart".  Sign up information is provided on this After Visit Summary.  MyChart is used to connect with patients for Virtual Visits (Telemedicine).  Patients are able to view lab/test results, encounter notes, upcoming appointments, etc.  Non-urgent messages can be sent to your provider as well.   To learn more about what you can do with MyChart, go to [email or web address].    Your next appointment:   6 month(s)  Provider:   Cameron Lambert, MD or Suzann Riddle, NP 

## 2022-12-11 ENCOUNTER — Ambulatory Visit: Admit: 2022-12-11 | Discharge: 2022-12-11 | Payer: PRIVATE HEALTH INSURANCE | Attending: Family Medicine

## 2022-12-11 DIAGNOSIS — R21 Rash and other nonspecific skin eruption: Secondary | ICD-10-CM

## 2022-12-11 NOTE — Patient Instructions (Signed)
Patient will use Claritin, ice, topical hydrocortisone and follow-up as needed with PCP

## 2022-12-11 NOTE — Progress Notes (Signed)
Subjective     Chief Complaint   Patient presents with    Insect Bite     Pt presents today for insect bite, pt was bitten by a wasp on left inner thigh, the area has started to swell.          Insect Bite  Associated symptoms: rash    Associated symptoms: no cough, no fever, no nausea, no shortness of breath and no vomiting     77 year old female presents for a wasp sting on the inner left thigh that occurred yesterday.  No fever chills nausea vomiting.  She noticed a little area of redness.  Patient has been taking Benadryl 2 times and she is also using hydrocortisone and icing locally.    History reviewed. No pertinent past medical history.    History reviewed. No pertinent surgical history.    History reviewed. No pertinent family history.    Allergies   Allergen Reactions    Ace Inhibitors Angioedema and Swelling     Throat, angio edema she thinks is the term   Other reaction(s): Angioedema   Throat, angio edema she thinks is the term    Other reaction(s): Angioedema    Throat, angio edema she thinks is the term    Penicillins Hives and Other (See Comments)    Lactose Other (See Comments)    Promethazine Other (See Comments)     Mental status changes       Social History     Tobacco Use    Smoking status: Never    Smokeless tobacco: Never   Substance Use Topics    Alcohol use: Never    Drug use: Never       Vitals:    12/11/22 1138   BP: (!) 155/70   Pulse: 59   Resp: 18   Temp: 98.2 F (36.8 C)   SpO2: 98%       Review of Systems   Constitutional:  Negative for chills and fever.   Respiratory:  Negative for cough and shortness of breath.    Gastrointestinal:  Negative for nausea and vomiting.   Skin:  Positive for rash.       Objective     Physical Exam  Vitals reviewed.   Constitutional:       Appearance: Normal appearance.   Cardiovascular:      Rate and Rhythm: Normal rate and regular rhythm.      Heart sounds: Normal heart sounds.   Pulmonary:      Effort: Pulmonary effort is normal.      Breath sounds:  Normal breath sounds.   Skin:     Findings: Erythema and rash present.      Comments: Bug bite left inner thigh, surrounding erythema and induration.  No red streaking.  Consistent with a histamine response   Neurological:      General: No focal deficit present.      Mental Status: She is alert and oriented to person, place, and time.   Psychiatric:         Mood and Affect: Mood normal.         Behavior: Behavior normal.         Assessment & Plan   Patient will use Claritin, ice, topical hydrocortisone and follow-up as needed with PCP  There are no diagnoses linked to this encounter.    No orders to display       No results found for any visits on 12/11/22.  I have discussed the results, diagnosis and treatment plan with the patient.  The patient also understands that early in the process of an illness, an urgent care workup can be falsely reassuring.  Routine discharge counseling and specific return precautions discussed with patient and the patient understands that worsening, changing or persistent symptoms should prompt an immediate return to the urgent care or emergency department.  Patient/Guardian expressed understanding and agrees with the discharge plan.  No further questions at time of discharge.    Wilma Flavin, MD

## 2022-12-29 ENCOUNTER — Ambulatory Visit: Payer: Medicare PPO | Admitting: Cardiology

## 2023-02-21 ENCOUNTER — Other Ambulatory Visit: Payer: Self-pay

## 2023-02-21 MED ORDER — VERAPAMIL HCL ER 120 MG PO CP24: 120.0000 mg | ORAL_CAPSULE | Freq: Two times a day (BID) | ORAL | 1 refills | Status: AC

## 2023-05-06 ENCOUNTER — Ambulatory Visit (INDEPENDENT_AMBULATORY_CARE_PROVIDER_SITE_OTHER): Payer: Medicare PPO

## 2023-05-06 ENCOUNTER — Encounter: Payer: Self-pay | Admitting: Podiatry

## 2023-05-06 ENCOUNTER — Ambulatory Visit: Payer: Medicare PPO | Admitting: Podiatry

## 2023-05-06 VITALS — Ht 64.0 in | Wt 164.0 lb

## 2023-05-06 DIAGNOSIS — M79671 Pain in right foot: Secondary | ICD-10-CM

## 2023-05-06 DIAGNOSIS — M84374A Stress fracture, right foot, initial encounter for fracture: Secondary | ICD-10-CM | POA: Diagnosis not present

## 2023-05-06 NOTE — Progress Notes (Signed)
   Chief Complaint  Patient presents with   Foot Injury    RM6: hurt right foot 1 month ago while driving and it is still bothersome-  is on feet 12 hrs a day recently Prolonged stand aggravates it, hurts mostly on dorsal foot area    HPI: 77 y.o. female presenting today as a new patient for evaluation of pain and tenderness associated to the lateral aspect of the right foot.  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  It began to improve after the election was over however the pain returned when she went to the beach for Thanksgiving weekend.  She did a significant amount of walking in the sand and exacerbated her pain.  She has been resting her foot over the following few weeks and she actually feels significantly better.  Past Medical History:  Diagnosis Date   Hypertension    Hypothyroidism    Idiopathic thrombocytopenic purpura (ITP) (HCC)    Thyroid disease     Past Surgical History:  Procedure Laterality Date   BACK SURGERY      Allergies  Allergen Reactions   Ace Inhibitors Swelling    Throat, angio edema she thinks is the term Other reaction(s): Angioedema Throat, angio edema she thinks is the term    Penicillins Hives and Other (See Comments)   Lactose Other (See Comments)   Promethazine Other (See Comments)    Mental status changes    Tilactase Other (See Comments)     Physical Exam: General: The patient is alert and oriented x3 in no acute distress.  Dermatology: Skin is warm, dry and supple bilateral lower extremities.   Vascular: Palpable pedal pulses bilaterally. Capillary refill within normal limits.  No appreciable edema.  No erythema.  Neurological: Grossly intact via light touch  Musculoskeletal Exam: No pedal deformities noted.  There is some slight tenderness with palpation directly to the proximal portion of the fourth metatarsal RT foot  Radiographic Exam RT foot 05/06/2023:   Normal osseous mineralization. Joint spaces preserved.  There are some slight cortical irregularity around the proximal portion of the fourth metatarsal possibly consistent with stress reaction fracture  Assessment/Plan of Care: 1.  Stress reaction fracture base of the fourth metatarsal RT foot clinical exam  -Patient evaluated.  X-rays reviewed -Over the past 3 weeks the foot has improved significantly.  She walked about 1.5 miles yesterday and has some mild achiness today but overall significant treatment. -Continue to wear good supportive shoes and sneakers -Continue OTC Motrin as needed -Recommend resting the foot and minimizing activity over the following 3-4 weeks to allow the foot to completely heal -Return to clinic.       Felecia Shelling, DPM [facility]  Dr. Felecia Shelling, DPM    [address] [place], [place] [address]                Office [phone]  Fax [phone]

## 2023-05-27 ENCOUNTER — Other Ambulatory Visit: Payer: Self-pay | Admitting: Nurse Practitioner

## 2023-05-27 DIAGNOSIS — R110 Nausea: Secondary | ICD-10-CM

## 2023-05-27 DIAGNOSIS — R10816 Epigastric abdominal tenderness: Secondary | ICD-10-CM

## 2023-05-27 DIAGNOSIS — R140 Abdominal distension (gaseous): Secondary | ICD-10-CM

## 2023-05-27 DIAGNOSIS — R1012 Left upper quadrant pain: Secondary | ICD-10-CM

## 2023-06-06 ENCOUNTER — Ambulatory Visit
Admission: RE | Admit: 2023-06-06 | Discharge: 2023-06-06 | Disposition: A | Discharge status: Home / Self Care | Payer: Medicare PPO | Source: Ambulatory Visit | Attending: Nurse Practitioner | Admitting: Nurse Practitioner

## 2023-06-06 DIAGNOSIS — R10816 Epigastric abdominal tenderness: Secondary | ICD-10-CM | POA: Insufficient documentation

## 2023-06-06 DIAGNOSIS — R140 Abdominal distension (gaseous): Secondary | ICD-10-CM | POA: Insufficient documentation

## 2023-06-06 DIAGNOSIS — R1012 Left upper quadrant pain: Secondary | ICD-10-CM | POA: Insufficient documentation

## 2023-06-06 MED ORDER — IOHEXOL 300 MG/ML  SOLN
100.0000 mL | Freq: Once | INTRAMUSCULAR | Status: AC | PRN
  Administered 2023-06-06: 100 mL via INTRAVENOUS

## 2023-07-15 ENCOUNTER — Encounter: Payer: Self-pay | Admitting: Gastroenterology

## 2023-07-19 NOTE — Progress Notes (Unsigned)
 Electrophysiology Clinic Note    Date:  07/20/2023  Patient ID:  Samantha Mccoy, Route Oct 21, 1945, MRN [id number] PCP:  Enid Baas, MD  Cardiologist:  None Electrophysiologist: Lanier Prude, MD  Discussed the use of AI scribe software for clinical note transcription with the patient, who gave verbal consent to proceed.   Patient Profile    Chief Complaint: palpitations, PVC follow-up  History of Present Illness: Samantha Mccoy is a 78 y.o. female with PMH notable for HTN, palpitations, PAC, PVCs, Hashimoto's, ITP ; seen today for Lanier Prude, MD for routine electrophysiology followup.  I last saw her 11/2022 where symptomatic PVCs were manageable on 120 verapamil BID. Walking / week, some SOB with inclines.   On follow=up today, she is feeling very well from a cardiac standpoint. Has not had any significant PVCs in several months. She has even started to add regular coffee beans to her decaf. She has not been exercising as much as she would like because of colder weather. Now that weather is warmer, planning to increase her walking.  She is concerned she has a stomach ulcer d/t prolonged steroid usage. Has a endoscopy and colonoscopy planned for next week. She denies bloody stools or dark, tarry stools. She has not been checking her BP regularly since she been feeling relatively well, BP log is 120-140s systolic.    AAD History: None     ROS:  Please see the history of present illness. All other systems are reviewed and otherwise negative.    Physical Exam    VS:  BP (!) 149/81 (BP Location: Left Arm, Patient Position: Sitting)   Pulse 66   Ht 5\' 4"  (1.626 m)   Wt 166 lb 9.6 oz (75.6 kg)   SpO2 97%   BMI 28.60 kg/m  BMI: Body mass index is 28.6 kg/m.  Wt Readings from Last 3 Encounters:  07/20/23 166 lb 9.6 oz (75.6 kg)  05/06/23 164 lb (74.4 kg)  12/01/22 164 lb (74.4 kg)     GEN- The patient is well appearing, alert and  oriented x 3 today.   Lungs- Clear to ausculation bilaterally, normal work of breathing.  Heart- Regular rate and rhythm, no murmurs, rubs or gallops Extremities- No peripheral edema, warm, dry    Studies Reviewed   Previous EP, cardiology notes.    EKG is ordered. Personal review of EKG from today shows:    EKG Interpretation Date/Time:  Wednesday July 20 2023 12:58:46 EST Ventricular Rate:  66 PR Interval:  192 QRS Duration:  94 QT Interval:  416 QTC Calculation: 436 R Axis:   -50  Text Interpretation: Normal sinus rhythm Left anterior fascicular block Confirmed by Sherie Don [phone]) on 07/20/2023 1:07:41 PM     TTE, 10/25/2022 1. Left ventricular ejection fraction, by estimation, is 55 to 60%. Left ventricular ejection fraction by 2D MOD biplane is 57.1 %. The left ventricle has normal function. The left ventricle has no regional wall motion abnormalities. There is mild left ventricular hypertrophy. Left ventricular diastolic parameters are consistent with Grade I diastolic dysfunction (impaired relaxation).   2. Right ventricular systolic function is normal. The right ventricular size is normal.   3. The mitral valve is normal in structure. Mild mitral valve regurgitation.   4. The aortic valve is tricuspid. Aortic valve regurgitation is mild.   5. The inferior vena cava is normal in size with greater than 50% respiratory variability, suggesting right atrial pressure of 3  mmHg.   Comparison(s): 05/26/21 55-60%, Mild MR/AI, m/m TR.    Long Term monitor, 10/12/2022 HR 44 - 143 bpm, average 66 bpm. 1 nonsustained SVT lasting 6 beats. Rare supraventricular ectopy. Occasional ventricular ectopy, 4.2%. No sustained arrhythmias. No atrial fibrillation. Symptom trigger episodes correspond to sinus with PVCs.   TTE 05/26/2021  1. Left ventricular ejection fraction, by estimation, is 55 to 60%. The left ventricle has normal function. The left ventricle has no regional wall motion  abnormalities. Left ventricular diastolic parameters are consistent with Grade I diastolic dysfunction (impaired relaxation).   2. Right ventricular systolic function is normal. The right ventricular  size is normal. There is normal pulmonary artery systolic pressure. The estimated right ventricular systolic pressure is 27.1 mmHg.   3. The mitral valve is normal in structure. Mild mitral valve regurgitation. No evidence of mitral stenosis.   4. Tricuspid valve regurgitation is mild to moderate.   5. The aortic valve is tricuspid. Aortic valve regurgitation is mild. No aortic stenosis is present.   6. The inferior vena cava is normal in size with greater than 50% respiratory variability, suggesting right atrial pressure of 3 mmHg.    Long Term Monitor, 03/2021 HR 45 - 150bpm, average 67 bpm. 1 SVT lasting 5 beats. Ectopic atrial rhythm present. Rare supraventricular and ventricular ectopy. Patient symptoms trigger events correspond to PAC/PVC.    Assessment and Plan    #) palpitations #) PVC #) PACs PVC burden by most recent long-term monitor 4% She is doing very well from a palpitation stand point Continue 120 verapamil BID   #) HTN Elevated in office and by home readings, though there are sparse home measurements Encouraged her to check BP more regularly, 2-3 times / weeek Encouraged her to continue walking She has PCP appt in the next few weeks         Current medicines are reviewed at length with the patient today.   The patient does not have concerns regarding her medicines.  The following changes were made today:  none  Labs/ tests ordered today include:  Orders Placed This Encounter  Procedures   EKG 12-Lead     Disposition: we discussed that it would be reasonable to follow-up with PCP only for ongoing PVC mgmt. She will discuss with PCP and notify us.  For now, will schedule follow up with Dr. Lalla Brothers or EP APP in 12 months   Signed, Sherie Don, NP   07/20/23  1:38 PM  Electrophysiology [facility]

## 2023-07-20 ENCOUNTER — Ambulatory Visit: Payer: Medicare PPO | Attending: Cardiology | Admitting: Cardiology

## 2023-07-20 VITALS — BP 149/81 | HR 66 | Ht 64.0 in | Wt 166.6 lb

## 2023-07-20 DIAGNOSIS — R002 Palpitations: Secondary | ICD-10-CM | POA: Diagnosis not present

## 2023-07-20 DIAGNOSIS — I493 Ventricular premature depolarization: Secondary | ICD-10-CM

## 2023-07-20 DIAGNOSIS — I10 Essential (primary) hypertension: Secondary | ICD-10-CM

## 2023-07-20 DIAGNOSIS — I491 Atrial premature depolarization: Secondary | ICD-10-CM

## 2023-07-20 NOTE — Patient Instructions (Signed)
 Medication Instructions:  The current medical regimen is effective;  continue present plan and medications.  *If you need a refill on your cardiac medications before your next appointment, please call your pharmacy*   Follow-Up: At [facility], you and your health needs are our priority.  As part of our continuing mission to provide you with exceptional heart care, we have created designated Provider Care Teams.  These Care Teams include your primary Cardiologist (physician) and Advanced Practice Providers (APPs -  Physician Assistants and Nurse Practitioners) who all work together to provide you with the care you need, when you need it.  We recommend signing up for the patient portal called "MyChart".  Sign up information is provided on this After Visit Summary.  MyChart is used to connect with patients for Virtual Visits (Telemedicine).  Patients are able to view lab/test results, encounter notes, upcoming appointments, etc.  Non-urgent messages can be sent to your provider as well.   To learn more about what you can do with MyChart, go to [email or web address].    Your next appointment:   12 month(s)  Provider:   Sherie Don, NP

## 2023-07-22 ENCOUNTER — Encounter: Payer: Self-pay | Admitting: Gastroenterology

## 2023-07-24 NOTE — H&P (Signed)
 Pre-Procedure H&P   Patient ID: Samantha Mccoy is a 78 y.o. female.  Gastroenterology Provider: Jaynie Collins, DO  Referring Provider: Fransico Setters, NP PCP: Enid Baas, MD  Date: 07/25/2023  HPI Samantha Mccoy is a 78 y.o. female who presents today for Esophagogastroduodenoscopy and Colonoscopy for LUQ pain, reflux, gastritis, colorectal cancer screening   Pt notes postprandial discomfort and worsening reflux since completing a course of prednisone for covid19 infection. Has brash taste in her mouth relating to reflux  H pylori stool negative, lipase wnl; [place] no signs of hepatosplenomegaly H/o itp Hgb 13.3 mcv 91 plt 249K cr 0.8  Csy 03/2013- normal exam including normal ti   Past Medical History:  Diagnosis Date   Cancer (HCC)    melanoma   Dysrhythmia    Hypertension    Hypothyroidism    Idiopathic thrombocytopenic purpura (ITP) (HCC)    Thyroid disease     Past Surgical History:  Procedure Laterality Date   BACK SURGERY     COLONOSCOPY      Family History Daughter- celiac disease No other h/o GI disease or malignancy   Review of Systems  Constitutional:  Negative for activity change, appetite change, chills, diaphoresis, fatigue, fever and unexpected weight change.  HENT:  Negative for trouble swallowing and voice change.   Respiratory:  Negative for shortness of breath and wheezing.   Cardiovascular:  Negative for chest pain, palpitations and leg swelling.  Gastrointestinal:  Positive for abdominal pain. Negative for abdominal distention, anal bleeding, blood in stool, constipation, diarrhea, nausea, rectal pain and vomiting.  Musculoskeletal:  Negative for arthralgias and myalgias.  Skin:  Negative for color change and pallor.  Neurological:  Negative for dizziness, syncope and weakness.  Psychiatric/Behavioral:  Negative for confusion.   All other systems reviewed and are negative.    Medications No current  facility-administered medications on file prior to encounter.   Current Outpatient Medications on File Prior to Encounter  Medication Sig Dispense Refill   ascorbic acid (VITAMIN C) 1000 MG tablet Take by mouth every other day.     B Complex Vitamins (VITAMIN-B COMPLEX PO) Take by mouth every other day.     Cholecalciferol 50 MCG (2000 UT) CAPS Take by mouth daily.     levothyroxine (SYNTHROID) 25 MCG tablet Take 25 mcg by mouth daily before breakfast.     losartan (COZAAR) 50 MG tablet Take 50 mg by mouth daily.     MAGNESIUM PO Take 250 mg by mouth daily.     potassium chloride (KLOR-CON) 10 MEQ tablet Take 10 mEq by mouth daily.     TURMERIC PO Take by mouth daily.     verapamil (VERELAN) 120 MG 24 hr capsule Take 1 capsule (120 mg total) by mouth in the morning and at bedtime. 180 capsule 1   fluticasone (FLONASE) 50 MCG/ACT nasal spray Place into both nostrils daily as needed.     Lactobacillus Rhamnosus, GG, (RA PROBIOTIC DIGESTIVE CARE) CAPS Take by mouth daily.     melatonin 3 MG TABS tablet Take by mouth at bedtime as needed.      Pertinent medications related to GI and procedure were reviewed by me with the patient prior to the procedure   Current Facility-Administered Medications:    0.9 %  sodium chloride infusion, , Intravenous, Continuous, Jaynie Collins, DO, Last Rate: 20 mL/hr at 07/25/23 1219, New Bag at 07/25/23 1219  sodium chloride 20 mL/hr at 07/25/23 1219  Allergies  Allergen Reactions   Ace Inhibitors Swelling    Throat, angio edema she thinks is the term Other reaction(s): Angioedema Throat, angio edema she thinks is the term    Penicillins Hives and Other (See Comments)   Lactose Other (See Comments)   Promethazine Other (See Comments)    Mental status changes    Tilactase Other (See Comments)   Allergies were reviewed by me prior to the procedure  Objective   Body mass index is 27.91 kg/m. Vitals:   07/25/23 1209  BP: (!) 170/66   Pulse: 61  Temp: (!) 97 F (36.1 C)  TempSrc: Temporal  SpO2: 100%  Weight: 73.8 kg  Height: 5\' 4"  (1.626 m)     Physical Exam Vitals and nursing note reviewed.  Constitutional:      General: She is not in acute distress.    Appearance: Normal appearance. She is not ill-appearing, toxic-appearing or diaphoretic.  HENT:     Head: Normocephalic and atraumatic.     Nose: Nose normal.     Mouth/Throat:     Mouth: Mucous membranes are moist.     Pharynx: Oropharynx is clear.  Eyes:     General: No scleral icterus.    Extraocular Movements: Extraocular movements intact.  Cardiovascular:     Rate and Rhythm: Regular rhythm. Bradycardia present.     Heart sounds: Normal heart sounds. No murmur heard.    No friction rub. No gallop.  Pulmonary:     Effort: Pulmonary effort is normal. No respiratory distress.     Breath sounds: Normal breath sounds. No wheezing, rhonchi or rales.  Abdominal:     General: Bowel sounds are normal. There is no distension.     Palpations: Abdomen is soft.     Tenderness: There is no abdominal tenderness. There is no guarding or rebound.  Musculoskeletal:     Cervical back: Neck supple.     Right lower leg: No edema.     Left lower leg: No edema.  Skin:    General: Skin is warm and dry.     Coloration: Skin is not jaundiced or pale.  Neurological:     General: No focal deficit present.     Mental Status: She is alert and oriented to person, place, and time. Mental status is at baseline.  Psychiatric:        Mood and Affect: Mood normal.        Behavior: Behavior normal.        Thought Content: Thought content normal.        Judgment: Judgment normal.      Assessment:  Ms. Samantha Mccoy is a 78 y.o. female  who presents today for Esophagogastroduodenoscopy and Colonoscopy for LUQ pain, reflux, gastritis, colorectal cancer screening .  Plan:  Esophagogastroduodenoscopy and Colonoscopy with possible intervention  today  Esophagogastroduodenoscopy and Colonoscopy with possible biopsy, control of bleeding, polypectomy, and interventions as necessary has been discussed with the patient/patient representative. Informed consent was obtained from the patient/patient representative after explaining the indication, nature, and risks of the procedure including but not limited to death, bleeding, perforation, missed neoplasm/lesions, cardiorespiratory compromise, and reaction to medications. Opportunity for questions was given and appropriate answers were provided. Patient/patient representative has verbalized understanding is amenable to undergoing the procedure.   Jaynie Collins, DO  [facility]  Portions of the record may have been created with voice recognition software. Occasional wrong-word or 'sound-a-like' substitutions may have occurred due to the  inherent limitations of voice recognition software.  Read the chart carefully and recognize, using context, where substitutions may have occurred.

## 2023-07-25 ENCOUNTER — Ambulatory Visit
Admission: RE | Admit: 2023-07-25 | Discharge: 2023-07-25 | Disposition: A | Discharge status: Home / Self Care | Payer: Medicare PPO | Source: Ambulatory Visit | Attending: Gastroenterology | Admitting: Gastroenterology

## 2023-07-25 ENCOUNTER — Other Ambulatory Visit: Payer: Self-pay

## 2023-07-25 ENCOUNTER — Ambulatory Visit: Admitting: Anesthesiology

## 2023-07-25 ENCOUNTER — Encounter
Admission: RE | Disposition: A | Discharge status: Home / Self Care | Payer: Self-pay | Source: Ambulatory Visit | Attending: Gastroenterology

## 2023-07-25 ENCOUNTER — Encounter: Payer: Self-pay | Admitting: Gastroenterology

## 2023-07-25 DIAGNOSIS — Z8616 Personal history of COVID-19: Secondary | ICD-10-CM | POA: Insufficient documentation

## 2023-07-25 DIAGNOSIS — K5730 Diverticulosis of large intestine without perforation or abscess without bleeding: Secondary | ICD-10-CM | POA: Insufficient documentation

## 2023-07-25 DIAGNOSIS — K219 Gastro-esophageal reflux disease without esophagitis: Secondary | ICD-10-CM | POA: Diagnosis not present

## 2023-07-25 DIAGNOSIS — K5520 Angiodysplasia of colon without hemorrhage: Secondary | ICD-10-CM | POA: Insufficient documentation

## 2023-07-25 DIAGNOSIS — K644 Residual hemorrhoidal skin tags: Secondary | ICD-10-CM | POA: Diagnosis not present

## 2023-07-25 DIAGNOSIS — R1012 Left upper quadrant pain: Secondary | ICD-10-CM | POA: Insufficient documentation

## 2023-07-25 DIAGNOSIS — D693 Immune thrombocytopenic purpura: Secondary | ICD-10-CM | POA: Diagnosis not present

## 2023-07-25 DIAGNOSIS — K317 Polyp of stomach and duodenum: Secondary | ICD-10-CM | POA: Diagnosis not present

## 2023-07-25 DIAGNOSIS — K2960 Other gastritis without bleeding: Secondary | ICD-10-CM | POA: Insufficient documentation

## 2023-07-25 DIAGNOSIS — Z1211 Encounter for screening for malignant neoplasm of colon: Secondary | ICD-10-CM | POA: Diagnosis not present

## 2023-07-25 HISTORY — PX: ESOPHAGOGASTRODUODENOSCOPY (EGD) WITH PROPOFOL: SHX5813

## 2023-07-25 HISTORY — PX: BIOPSY: SHX5522

## 2023-07-25 HISTORY — DX: Malignant (primary) neoplasm, unspecified: C80.1

## 2023-07-25 HISTORY — PX: COLONOSCOPY WITH PROPOFOL: SHX5780

## 2023-07-25 HISTORY — DX: Cardiac arrhythmia, unspecified: I49.9

## 2023-07-25 HISTORY — PX: POLYPECTOMY: SHX5525

## 2023-07-25 SURGERY — COLONOSCOPY WITH PROPOFOL
Anesthesia: General

## 2023-07-25 MED ORDER — PROPOFOL 1000 MG/100ML IV EMUL
INTRAVENOUS | Status: AC
  Filled 2023-07-25: qty 100

## 2023-07-25 MED ORDER — LIDOCAINE HCL (PF) 2 % IJ SOLN
INTRAMUSCULAR | Status: AC
  Filled 2023-07-25: qty 5

## 2023-07-25 MED ORDER — PROPOFOL 10 MG/ML IV BOLUS
INTRAVENOUS | Status: DC | PRN
  Administered 2023-07-25: 50 mg via INTRAVENOUS

## 2023-07-25 MED ORDER — PROPOFOL 500 MG/50ML IV EMUL
INTRAVENOUS | Status: DC | PRN
  Administered 2023-07-25: 150 ug/kg/min via INTRAVENOUS

## 2023-07-25 MED ORDER — LIDOCAINE HCL (CARDIAC) PF 100 MG/5ML IV SOSY
PREFILLED_SYRINGE | INTRAVENOUS | Status: DC | PRN
  Administered 2023-07-25: 60 mg via INTRAVENOUS

## 2023-07-25 MED ORDER — SODIUM CHLORIDE 0.9 % IV SOLN: INTRAVENOUS | Status: DC

## 2023-07-25 NOTE — Op Note (Signed)
 [facility]astroenter[facility] Patient Name: Samantha Mccoy Procedure Date: 07/25/2023 12:08 PM MRN: [id number] Account #: [id number] Date of Birth: December 19, 1945 Admit Type: Outpatient Age: 78 Room: [facility] ENDO ROOM 2 Gender: Female Note Status: Finalized Instrument Name: Peds Colonoscope [id number] Procedure:             Colonoscopy Indications:           Screening for colorectal malignant neoplasm Providers:             Jaynie Collins DO, DO Referring MD:          Enid Baas, MD (Referring MD) Medicines:             Monitored Anesthesia Care Complications:         No immediate complications. Estimated blood loss:                         Minimal. Procedure:             Pre-Anesthesia Assessment:                        - Prior to the procedure, a History and Physical was                         performed, and patient medications and allergies were                         reviewed. The patient is competent. The risks and                         benefits of the procedure and the sedation options and                         risks were discussed with the patient. All questions                         were answered and informed consent was obtained.                         Patient identification and proposed procedure were                         verified by the physician, the nurse, the anesthetist                         and the technician in the endoscopy suite. Mental                         Status Examination: alert and oriented. Airway                         Examination: normal oropharyngeal airway and neck                         mobility. Respiratory Examination: clear to                         auscultation. CV Examination: RRR, no murmurs, no S3  or S4. Prophylactic Antibiotics: The patient does not                         require prophylactic antibiotics. Prior                         Anticoagulants: The patient has taken no  anticoagulant                         or antiplatelet agents. ASA Grade Assessment: II - A                         patient with mild systemic disease. After reviewing                         the risks and benefits, the patient was deemed in                         satisfactory condition to undergo the procedure. The                         anesthesia plan was to use monitored anesthesia care                         (MAC). Immediately prior to administration of                         medications, the patient was re-assessed for adequacy                         to receive sedatives. The heart rate, respiratory                         rate, oxygen saturations, blood pressure, adequacy of                         pulmonary ventilation, and response to care were                         monitored throughout the procedure. The physical                         status of the patient was re-assessed after the                         procedure.                        After obtaining informed consent, the colonoscope was                         passed under direct vision. Throughout the procedure,                         the patient's blood pressure, pulse, and oxygen                         saturations were monitored continuously. The  Colonoscope was introduced through the anus and                         advanced to the the terminal ileum, with                         identification of the appendiceal orifice and IC                         valve. The colonoscopy was performed without                         difficulty. The patient tolerated the procedure well.                         The quality of the bowel preparation was evaluated                         using the BBPS [facility] Bowel Preparation Scale) with                         scores of: Right Colon = 3, Transverse Colon = 3 and                         Left Colon = 3 (entire mucosa seen well with no                          residual staining, small fragments of stool or opaque                         liquid). The total BBPS score equals 9. The terminal                         ileum, ileocecal valve, appendiceal orifice, and                         rectum were photographed. Findings:      Skin tags were found on perianal exam.      The digital rectal exam was normal. Pertinent negatives include normal       sphincter tone.      The terminal ileum appeared normal. Estimated blood loss: none.      Retroflexion in the right colon was performed.      A 1 to 2 mm polyp was found in the sigmoid colon. The polyp was sessile.       The polyp was removed with a jumbo cold forceps. Resection and retrieval       were complete. Estimated blood loss was minimal.      A single small localized angioectasia without bleeding was found in the       transverse colon. Estimated blood loss: none.      Scattered small-mouthed diverticula were found in the entire colon.       Estimated blood loss: none.      The exam was otherwise without abnormality on direct and retroflexion       views. Impression:            - Perianal skin tags found on perianal exam.                        -  The examined portion of the ileum was normal.                        - One 1 to 2 mm polyp in the sigmoid colon, removed                         with a jumbo cold forceps. Resected and retrieved.                        - A single non-bleeding colonic angioectasia.                        - Diverticulosis in the entire examined colon.                        - The examination was otherwise normal on direct and                         retroflexion views. Recommendation:        - Patient has a contact number available for                         emergencies. The signs and symptoms of potential                         delayed complications were discussed with the patient.                         Return to normal activities tomorrow. Written                          discharge instructions were provided to the patient.                        - Discharge patient to home.                        - Resume previous diet.                        - Continue present medications.                        - Await pathology results.                        - Return to referring physician as previously                         scheduled.                        - No further screening/surveillance colonoscopies                         given advanced age at next due (7-10 years)                        - The findings and recommendations were discussed with  the patient. Procedure Code(s):     --- Professional ---                        [phone], Colonoscopy, flexible; with biopsy, single or                         multiple Diagnosis Code(s):     --- Professional ---                        Z12.11, Encounter for screening for malignant neoplasm                         of colon                        D12.5, Benign neoplasm of sigmoid colon                        K55.20, Angiodysplasia of colon without hemorrhage                        K64.4, Residual hemorrhoidal skin tags                        K57.30, Diverticulosis of large intestine without                         perforation or abscess without bleeding CPT copyright 2022 American Medical Association. All rights reserved. The codes documented in this report are preliminary and upon coder review may  be revised to meet current compliance requirements. Attending Participation:      I personally performed the entire procedure. Elfredia Nevins, DO Jaynie Collins DO, DO 07/25/2023 1:04:51 PM This report has been signed electronically. Number of Addenda: 0 Note Initiated On: 07/25/2023 12:08 PM Scope Withdrawal Time: 0 hours 10 minutes 35 seconds  Total Procedure Duration: 0 hours 15 minutes 7 seconds  Estimated Blood Loss:  Estimated blood loss was minimal.      [facility]

## 2023-07-25 NOTE — Transfer of Care (Signed)
 Immediate Anesthesia Transfer of Care Note  Patient: Samantha Mccoy  Procedure(s) Performed: COLONOSCOPY WITH PROPOFOL ESOPHAGOGASTRODUODENOSCOPY (EGD) WITH PROPOFOL BIOPSY POLYPECTOMY  Patient Location: PACU  Anesthesia Type:General  Level of Consciousness: awake and sedated  Airway & Oxygen Therapy: Patient Spontanous Breathing and Patient connected to nasal cannula oxygen  Post-op Assessment: Report given to RN and Post -op Vital signs reviewed and stable  Post vital signs: Reviewed and stable  Last Vitals:  Vitals Value Taken Time  BP    Temp    Pulse    Resp    SpO2      Last Pain:  Vitals:   07/25/23 1209  TempSrc: Temporal  PainSc: 0-No pain         Complications: No notable events documented.

## 2023-07-25 NOTE — Interval H&P Note (Signed)
 History and Physical Interval Note: Preprocedure H&P from 07/25/23  was reviewed and there was no interval change after seeing and examining the patient.  Written consent was obtained from the patient after discussion of risks, benefits, and alternatives. Patient has consented to proceed with Esophagogastroduodenoscopy and Colonoscopy with possible intervention   07/25/2023 12:22 PM  Samantha Mccoy [facility]  has presented today for surgery, with the diagnosis of R10.12 (ICD-10-CM) - Left upper quadrant abdominal pain R11.0 (ICD-10-CM) - Nausea K29.60 (ICD-10-CM) - Reflux gastritis R14.0 (ICD-10-CM) - Bloating Z12.11 (ICD-10-CM) - Screening for colon cancer.  The various methods of treatment have been discussed with the patient and family. After consideration of risks, benefits and other options for treatment, the patient has consented to  Procedure(s): COLONOSCOPY WITH PROPOFOL (N/A) ESOPHAGOGASTRODUODENOSCOPY (EGD) WITH PROPOFOL (N/A) as a surgical intervention.  The patient's history has been reviewed, patient examined, no change in status, stable for surgery.  I have reviewed the patient's chart and labs.  Questions were answered to the patient's satisfaction.     Jaynie Collins

## 2023-07-25 NOTE — Anesthesia Preprocedure Evaluation (Signed)
 Anesthesia Evaluation  Patient identified by MRN, date of birth, ID band Patient awake    Reviewed: Allergy & Precautions, NPO status , Patient's Chart, lab work & pertinent test results  History of Anesthesia Complications Negative for: history of anesthetic complications  Airway Mallampati: III  TM Distance: <3 FB Neck ROM: Full  Mouth opening: Limited Mouth Opening  Dental no notable dental hx. (+) Teeth Intact   Pulmonary neg pulmonary ROS, neg sleep apnea, neg COPD, Patient abstained from smoking.Not current smoker   Pulmonary exam normal breath sounds clear to auscultation       Cardiovascular Exercise Tolerance: Good METShypertension, Pt. on medications (-) CAD and (-) Past MI + dysrhythmias  Rhythm:Regular Rate:Normal - Systolic murmurs 4% PVC burden    Neuro/Psych negative neurological ROS  negative psych ROS   GI/Hepatic ,neg GERD  ,,(+)     (-) substance abuse    Endo/Other  neg diabetesHypothyroidism    Renal/GU negative Renal ROS     Musculoskeletal   Abdominal   Peds  Hematology   Anesthesia Other Findings Past Medical History: No date: Cancer (HCC)     Comment:  melanoma No date: Dysrhythmia No date: Hypertension No date: Hypothyroidism No date: Idiopathic thrombocytopenic purpura (ITP) (HCC) No date: Thyroid disease  Reproductive/Obstetrics                             Anesthesia Physical Anesthesia Plan  ASA: 2  Anesthesia Plan: General   Post-op Pain Management: Minimal or no pain anticipated   Induction: Intravenous  PONV Risk Score and Plan: 3 and Propofol infusion, TIVA and Ondansetron  Airway Management Planned: Nasal Cannula  Additional Equipment: None  Intra-op Plan:   Post-operative Plan:   Informed Consent: I have reviewed the patients History and Physical, chart, labs and discussed the procedure including the risks, benefits and alternatives  for the proposed anesthesia with the patient or authorized representative who has indicated his/her understanding and acceptance.     Dental advisory given  Plan Discussed with: CRNA and Surgeon  Anesthesia Plan Comments: (Discussed risks of anesthesia with patient, including possibility of difficulty with spontaneous ventilation under anesthesia necessitating airway intervention, PONV, and rare risks such as cardiac or respiratory or neurological events, and allergic reactions. Discussed the role of CRNA in patient's perioperative care. Patient understands.)       Anesthesia Quick Evaluation

## 2023-07-25 NOTE — Op Note (Signed)
 [facility] Gastroenterology Patient Name: Samantha Mccoy Procedure Date: 07/25/2023 12:09 PM MRN: [id number] Account #: [id number] Date of Birth: 05-14-1946 Admit Type: Outpatient Age: 78 Room: [facility] ENDO ROOM 2 Gender: Female Note Status: Finalized Instrument Name: Patton Salles Endoscope [id number] Procedure:             Upper GI endoscopy Indications:           Abdominal pain in the left upper quadrant Providers:             Trenda Moots, DO Referring MD:          Enid Baas, MD (Referring MD) Medicines:             Monitored Anesthesia Care Complications:         No immediate complications. Estimated blood loss:                         Minimal. Procedure:             Pre-Anesthesia Assessment:                        - Prior to the procedure, a History and Physical was                         performed, and patient medications and allergies were                         reviewed. The patient is competent. The risks and                         benefits of the procedure and the sedation options and                         risks were discussed with the patient. All questions                         were answered and informed consent was obtained.                         Patient identification and proposed procedure were                         verified by the physician, the nurse, the anesthetist                         and the technician in the endoscopy suite. Mental                         Status Examination: alert and oriented. Airway                         Examination: normal oropharyngeal airway and neck                         mobility. Respiratory Examination: clear to                         auscultation. CV Examination: RRR, no murmurs, no S3  or S4. Prophylactic Antibiotics: The patient does not                         require prophylactic antibiotics. Prior                         Anticoagulants: The patient has taken no  anticoagulant                         or antiplatelet agents. ASA Grade Assessment: II - A                         patient with mild systemic disease. After reviewing                         the risks and benefits, the patient was deemed in                         satisfactory condition to undergo the procedure. The                         anesthesia plan was to use monitored anesthesia care                         (MAC). Immediately prior to administration of                         medications, the patient was re-assessed for adequacy                         to receive sedatives. The heart rate, respiratory                         rate, oxygen saturations, blood pressure, adequacy of                         pulmonary ventilation, and response to care were                         monitored throughout the procedure. The physical                         status of the patient was re-assessed after the                         procedure.                        After obtaining informed consent, the endoscope was                         passed under direct vision. Throughout the procedure,                         the patient's blood pressure, pulse, and oxygen                         saturations were monitored continuously. The Endoscope  was introduced through the mouth, and advanced to the                         third part of duodenum. The upper GI endoscopy was                         accomplished without difficulty. The patient tolerated                         the procedure well. Findings:      The duodenal bulb, first portion of the duodenum, second portion of the       duodenum and third portion of the duodenum were normal. Biopsies for       histology were taken with a cold forceps for evaluation of celiac       disease. Estimated blood loss was minimal.      Multiple 1 to 9 mm sessile polyps with no bleeding and no stigmata of       recent bleeding were found in  the gastric body. Largest polyp ~74mm was       in the greater curvature. This was biopsied. Biopsies were taken with a       cold forceps for histology. Estimated blood loss was minimal.      Normal mucosa was found in the entire examined stomach. Biopsies were       taken with a cold forceps for Helicobacter pylori testing. Estimated       blood loss was minimal.      The exam of the stomach was otherwise normal.      The Z-line was regular.      Esophagogastric landmarks were identified: the gastroesophageal junction       was found at 35 cm from the incisors.      The exam of the esophagus was otherwise normal. Impression:            - Normal duodenal bulb, first portion of the duodenum,                         second portion of the duodenum and third portion of                         the duodenum. Biopsied.                        - Multiple gastric polyps. Biopsied.                        - Normal mucosa was found in the entire stomach.                         Biopsied.                        - Z-line regular.                        - Esophagogastric landmarks identified. Recommendation:        - Patient has a contact number available for                         emergencies. The signs and  symptoms of potential                         delayed complications were discussed with the patient.                         Return to normal activities tomorrow. Written                         discharge instructions were provided to the patient.                        - Discharge patient to home.                        - Resume previous diet.                        - Continue present medications.                        - Await pathology results.                        - Return to GI clinic as previously scheduled.                        - proceed with colonoscopy. see report for further                         recommendations.                        - The findings and recommendations were  discussed with                         the patient. Procedure Code(s):     --- Professional ---                        [phone], Esophagogastroduodenoscopy, flexible,                         transoral; with biopsy, single or multiple Diagnosis Code(s):     --- Professional ---                        K31.7, Polyp of stomach and duodenum                        R10.12, Left upper quadrant pain CPT copyright 2022 [employer]. All rights reserved. The codes documented in this report are preliminary and upon coder review may  be revised to meet current compliance requirements. Attending Participation:      I personally performed the entire procedure. Elfredia Nevins, DO Jaynie Collins DO, DO 07/25/2023 12:41:17 PM This report has been signed electronically. Number of Addenda: 0 Note Initiated On: 07/25/2023 12:09 PM Estimated Blood Loss:  Estimated blood loss was minimal.      [facility]

## 2023-07-26 ENCOUNTER — Encounter: Payer: Self-pay | Admitting: Gastroenterology

## 2023-07-27 ENCOUNTER — Other Ambulatory Visit: Payer: Self-pay | Admitting: Internal Medicine

## 2023-07-27 DIAGNOSIS — Z1231 Encounter for screening mammogram for malignant neoplasm of breast: Secondary | ICD-10-CM

## 2023-07-27 LAB — SURGICAL PATHOLOGY

## 2023-07-27 NOTE — Anesthesia Postprocedure Evaluation (Signed)
 Anesthesia Post Note  Patient: Samantha Mccoy [facility]  Procedure(s) Performed: COLONOSCOPY WITH PROPOFOL ESOPHAGOGASTRODUODENOSCOPY (EGD) WITH PROPOFOL BIOPSY POLYPECTOMY  Patient location during evaluation: Endoscopy Anesthesia Type: General Level of consciousness: awake and alert Pain management: pain level controlled Vital Signs Assessment: post-procedure vital signs reviewed and stable Respiratory status: spontaneous breathing, nonlabored ventilation, respiratory function stable and patient connected to nasal cannula oxygen Cardiovascular status: blood pressure returned to baseline and stable Postop Assessment: no apparent nausea or vomiting Anesthetic complications: no   No notable events documented.   Last Vitals:  Vitals:   07/25/23 1312 07/25/23 1332  BP: (!) 110/52 (!) 153/71  Pulse:    Temp:    SpO2:      Last Pain:  Vitals:   07/25/23 1332  TempSrc:   PainSc: 0-No pain                 Lenard Simmer
